# Patient Record
Sex: Female | Born: 1950 | Race: White | Hispanic: No | Marital: Married | Smoking: Former smoker
Health system: Southern US, Community
[De-identification: ages and names within clinical notes are randomized; demographics above are authoritative.]

## PROBLEM LIST (undated history)

## (undated) DIAGNOSIS — E0500 Thyrotoxicosis with diffuse goiter without thyrotoxic crisis or storm: Secondary | ICD-10-CM

## (undated) DIAGNOSIS — E039 Hypothyroidism, unspecified: Secondary | ICD-10-CM

## (undated) DIAGNOSIS — E559 Vitamin D deficiency, unspecified: Secondary | ICD-10-CM

## (undated) DIAGNOSIS — C801 Malignant (primary) neoplasm, unspecified: Secondary | ICD-10-CM

## (undated) DIAGNOSIS — E079 Disorder of thyroid, unspecified: Secondary | ICD-10-CM

## (undated) DIAGNOSIS — F329 Major depressive disorder, single episode, unspecified: Secondary | ICD-10-CM

## (undated) DIAGNOSIS — G479 Sleep disorder, unspecified: Secondary | ICD-10-CM

## (undated) DIAGNOSIS — R911 Solitary pulmonary nodule: Secondary | ICD-10-CM

## (undated) DIAGNOSIS — F419 Anxiety disorder, unspecified: Secondary | ICD-10-CM

## (undated) DIAGNOSIS — F32A Depression, unspecified: Secondary | ICD-10-CM

## (undated) HISTORY — DX: Vitamin D deficiency, unspecified: E55.9

## (undated) HISTORY — DX: Thyrotoxicosis with diffuse goiter without thyrotoxic crisis or storm: E05.00

## (undated) HISTORY — DX: Hypothyroidism, unspecified: E03.9

## (undated) HISTORY — DX: Anxiety disorder, unspecified: F41.9

## (undated) HISTORY — PX: PARATHYROIDECTOMY: SHX19

## (undated) HISTORY — PX: ABLATION: SHX5711

## (undated) HISTORY — DX: Solitary pulmonary nodule: R91.1

## (undated) HISTORY — PX: OTHER SURGICAL HISTORY: SHX169

## (undated) HISTORY — PX: COLONOSCOPY: SHX174

## (undated) HISTORY — DX: Hypercalcemia: E83.52

## (undated) HISTORY — PX: EYE SURGERY: SHX253

## (undated) HISTORY — DX: Disorder of thyroid, unspecified: E07.9

## (undated) HISTORY — DX: Sleep disorder, unspecified: G47.9

---

## 1999-05-18 ENCOUNTER — Encounter (INDEPENDENT_AMBULATORY_CARE_PROVIDER_SITE_OTHER): Payer: Self-pay

## 1999-05-18 ENCOUNTER — Other Ambulatory Visit: Admission: RE | Admit: 1999-05-18 | Discharge: 1999-05-18 | Payer: Self-pay | Admitting: Gynecology

## 1999-07-21 ENCOUNTER — Other Ambulatory Visit: Admission: RE | Admit: 1999-07-21 | Discharge: 1999-07-21 | Payer: Self-pay | Admitting: Gynecology

## 1999-07-21 ENCOUNTER — Encounter (INDEPENDENT_AMBULATORY_CARE_PROVIDER_SITE_OTHER): Payer: Self-pay | Admitting: Specialist

## 1999-09-26 ENCOUNTER — Encounter: Admission: RE | Admit: 1999-09-26 | Discharge: 1999-09-26 | Payer: Self-pay | Admitting: Family Medicine

## 1999-10-07 ENCOUNTER — Encounter: Admission: RE | Admit: 1999-10-07 | Discharge: 1999-10-07 | Payer: Self-pay | Admitting: Sports Medicine

## 1999-10-28 ENCOUNTER — Encounter: Admission: RE | Admit: 1999-10-28 | Discharge: 1999-10-28 | Payer: Self-pay | Admitting: Family Medicine

## 2000-02-03 ENCOUNTER — Encounter: Admission: RE | Admit: 2000-02-03 | Discharge: 2000-02-03 | Payer: Self-pay | Admitting: Family Medicine

## 2000-07-23 ENCOUNTER — Other Ambulatory Visit: Admission: RE | Admit: 2000-07-23 | Discharge: 2000-07-23 | Payer: Self-pay | Admitting: Gynecology

## 2001-01-18 ENCOUNTER — Encounter: Admission: RE | Admit: 2001-01-18 | Discharge: 2001-01-18 | Payer: Self-pay | Admitting: Family Medicine

## 2001-03-19 ENCOUNTER — Encounter: Admission: RE | Admit: 2001-03-19 | Discharge: 2001-03-19 | Payer: Self-pay | Admitting: Family Medicine

## 2001-08-21 ENCOUNTER — Other Ambulatory Visit: Admission: RE | Admit: 2001-08-21 | Discharge: 2001-08-21 | Payer: Self-pay | Admitting: Gynecology

## 2001-09-17 ENCOUNTER — Encounter: Admission: RE | Admit: 2001-09-17 | Discharge: 2001-09-17 | Payer: Self-pay | Admitting: Sports Medicine

## 2001-11-21 ENCOUNTER — Encounter: Admission: RE | Admit: 2001-11-21 | Discharge: 2001-11-21 | Payer: Self-pay | Admitting: Family Medicine

## 2002-01-06 ENCOUNTER — Encounter: Admission: RE | Admit: 2002-01-06 | Discharge: 2002-01-06 | Payer: Self-pay | Admitting: Family Medicine

## 2002-05-07 ENCOUNTER — Encounter: Admission: RE | Admit: 2002-05-07 | Discharge: 2002-05-07 | Payer: Self-pay | Admitting: Sports Medicine

## 2002-10-02 ENCOUNTER — Other Ambulatory Visit: Admission: RE | Admit: 2002-10-02 | Discharge: 2002-10-02 | Payer: Self-pay | Admitting: Gynecology

## 2003-01-30 ENCOUNTER — Encounter: Admission: RE | Admit: 2003-01-30 | Discharge: 2003-01-30 | Payer: Self-pay | Admitting: Family Medicine

## 2003-02-23 ENCOUNTER — Encounter: Admission: RE | Admit: 2003-02-23 | Discharge: 2003-02-23 | Payer: Self-pay | Admitting: Family Medicine

## 2003-03-26 ENCOUNTER — Encounter: Admission: RE | Admit: 2003-03-26 | Discharge: 2003-03-26 | Payer: Self-pay | Admitting: Family Medicine

## 2003-04-02 ENCOUNTER — Encounter: Admission: RE | Admit: 2003-04-02 | Discharge: 2003-04-02 | Payer: Self-pay | Admitting: Family Medicine

## 2003-06-02 ENCOUNTER — Encounter: Admission: RE | Admit: 2003-06-02 | Discharge: 2003-06-02 | Payer: Self-pay | Admitting: Family Medicine

## 2003-11-27 ENCOUNTER — Encounter: Admission: RE | Admit: 2003-11-27 | Discharge: 2003-11-27 | Payer: Self-pay | Admitting: Sports Medicine

## 2003-12-25 ENCOUNTER — Encounter: Admission: RE | Admit: 2003-12-25 | Discharge: 2003-12-25 | Payer: Self-pay | Admitting: Family Medicine

## 2004-01-22 ENCOUNTER — Encounter: Admission: RE | Admit: 2004-01-22 | Discharge: 2004-01-22 | Payer: Self-pay | Admitting: Family Medicine

## 2004-02-05 ENCOUNTER — Encounter: Admission: RE | Admit: 2004-02-05 | Discharge: 2004-02-05 | Payer: Self-pay | Admitting: Family Medicine

## 2004-03-09 ENCOUNTER — Encounter: Admission: RE | Admit: 2004-03-09 | Discharge: 2004-03-09 | Payer: Self-pay | Admitting: Family Medicine

## 2004-03-29 ENCOUNTER — Ambulatory Visit: Payer: Self-pay | Admitting: Family Medicine

## 2004-06-10 ENCOUNTER — Encounter (INDEPENDENT_AMBULATORY_CARE_PROVIDER_SITE_OTHER): Payer: Self-pay | Admitting: *Deleted

## 2004-06-10 ENCOUNTER — Ambulatory Visit (HOSPITAL_COMMUNITY): Admission: RE | Admit: 2004-06-10 | Discharge: 2004-06-10 | Payer: Self-pay | Admitting: Gastroenterology

## 2004-07-12 ENCOUNTER — Ambulatory Visit: Payer: Self-pay | Admitting: Family Medicine

## 2004-09-14 ENCOUNTER — Other Ambulatory Visit: Admission: RE | Admit: 2004-09-14 | Discharge: 2004-09-14 | Payer: Self-pay | Admitting: Gynecology

## 2005-05-05 ENCOUNTER — Ambulatory Visit: Payer: Self-pay | Admitting: Family Medicine

## 2005-10-18 ENCOUNTER — Other Ambulatory Visit: Admission: RE | Admit: 2005-10-18 | Discharge: 2005-10-18 | Payer: Self-pay | Admitting: Gynecology

## 2006-01-07 ENCOUNTER — Encounter (INDEPENDENT_AMBULATORY_CARE_PROVIDER_SITE_OTHER): Payer: Self-pay | Admitting: *Deleted

## 2006-01-07 LAB — CONVERTED CEMR LAB

## 2006-01-22 ENCOUNTER — Ambulatory Visit: Payer: Self-pay | Admitting: Family Medicine

## 2006-03-08 ENCOUNTER — Ambulatory Visit: Payer: Self-pay | Admitting: Family Medicine

## 2006-03-21 ENCOUNTER — Ambulatory Visit: Payer: Self-pay | Admitting: Sports Medicine

## 2006-06-07 ENCOUNTER — Ambulatory Visit: Payer: Self-pay | Admitting: Family Medicine

## 2006-09-06 DIAGNOSIS — E039 Hypothyroidism, unspecified: Secondary | ICD-10-CM | POA: Insufficient documentation

## 2006-09-06 DIAGNOSIS — F411 Generalized anxiety disorder: Secondary | ICD-10-CM | POA: Insufficient documentation

## 2006-09-06 DIAGNOSIS — F17200 Nicotine dependence, unspecified, uncomplicated: Secondary | ICD-10-CM | POA: Insufficient documentation

## 2006-09-06 DIAGNOSIS — F3289 Other specified depressive episodes: Secondary | ICD-10-CM | POA: Insufficient documentation

## 2006-09-06 DIAGNOSIS — F329 Major depressive disorder, single episode, unspecified: Secondary | ICD-10-CM | POA: Insufficient documentation

## 2006-09-06 DIAGNOSIS — J309 Allergic rhinitis, unspecified: Secondary | ICD-10-CM | POA: Insufficient documentation

## 2006-09-06 DIAGNOSIS — E0500 Thyrotoxicosis with diffuse goiter without thyrotoxic crisis or storm: Secondary | ICD-10-CM | POA: Insufficient documentation

## 2006-09-06 DIAGNOSIS — G4700 Insomnia, unspecified: Secondary | ICD-10-CM | POA: Insufficient documentation

## 2006-09-07 ENCOUNTER — Encounter (INDEPENDENT_AMBULATORY_CARE_PROVIDER_SITE_OTHER): Payer: Self-pay | Admitting: *Deleted

## 2006-12-05 ENCOUNTER — Telehealth: Payer: Self-pay | Admitting: *Deleted

## 2006-12-06 ENCOUNTER — Encounter (INDEPENDENT_AMBULATORY_CARE_PROVIDER_SITE_OTHER): Payer: Self-pay | Admitting: Family Medicine

## 2006-12-06 ENCOUNTER — Ambulatory Visit: Payer: Self-pay | Admitting: Family Medicine

## 2006-12-17 ENCOUNTER — Telehealth (INDEPENDENT_AMBULATORY_CARE_PROVIDER_SITE_OTHER): Payer: Self-pay | Admitting: Family Medicine

## 2006-12-18 ENCOUNTER — Telehealth (INDEPENDENT_AMBULATORY_CARE_PROVIDER_SITE_OTHER): Payer: Self-pay | Admitting: Family Medicine

## 2007-01-02 ENCOUNTER — Telehealth (INDEPENDENT_AMBULATORY_CARE_PROVIDER_SITE_OTHER): Payer: Self-pay | Admitting: Family Medicine

## 2007-01-02 ENCOUNTER — Ambulatory Visit: Payer: Self-pay | Admitting: Family Medicine

## 2007-01-03 ENCOUNTER — Encounter (INDEPENDENT_AMBULATORY_CARE_PROVIDER_SITE_OTHER): Payer: Self-pay | Admitting: *Deleted

## 2007-01-10 ENCOUNTER — Telehealth (INDEPENDENT_AMBULATORY_CARE_PROVIDER_SITE_OTHER): Payer: Self-pay | Admitting: Family Medicine

## 2007-01-18 ENCOUNTER — Encounter (INDEPENDENT_AMBULATORY_CARE_PROVIDER_SITE_OTHER): Payer: Self-pay | Admitting: Family Medicine

## 2007-01-24 ENCOUNTER — Telehealth (INDEPENDENT_AMBULATORY_CARE_PROVIDER_SITE_OTHER): Payer: Self-pay | Admitting: *Deleted

## 2007-02-11 ENCOUNTER — Encounter (INDEPENDENT_AMBULATORY_CARE_PROVIDER_SITE_OTHER): Payer: Self-pay | Admitting: Family Medicine

## 2007-02-12 ENCOUNTER — Other Ambulatory Visit: Admission: RE | Admit: 2007-02-12 | Discharge: 2007-02-12 | Payer: Self-pay | Admitting: Gynecology

## 2007-02-25 ENCOUNTER — Encounter (INDEPENDENT_AMBULATORY_CARE_PROVIDER_SITE_OTHER): Payer: Self-pay | Admitting: Family Medicine

## 2008-05-28 ENCOUNTER — Other Ambulatory Visit: Admission: RE | Admit: 2008-05-28 | Discharge: 2008-05-28 | Payer: Self-pay | Admitting: Gynecology

## 2009-12-15 ENCOUNTER — Emergency Department (HOSPITAL_COMMUNITY): Admission: EM | Admit: 2009-12-15 | Discharge: 2009-12-15 | Payer: Self-pay | Admitting: Emergency Medicine

## 2010-09-26 LAB — DIFFERENTIAL
Basophils Absolute: 0.1 10*3/uL (ref 0.0–0.1)
Basophils Relative: 1 % (ref 0–1)
Eosinophils Absolute: 0.1 10*3/uL (ref 0.0–0.7)
Eosinophils Relative: 2 % (ref 0–5)
Lymphocytes Relative: 33 % (ref 12–46)
Lymphs Abs: 2.4 10*3/uL (ref 0.7–4.0)
Neutrophils Relative %: 61 % (ref 43–77)

## 2010-09-26 LAB — COMPREHENSIVE METABOLIC PANEL
ALT: 18 U/L (ref 0–35)
AST: 20 U/L (ref 0–37)
Albumin: 4.2 g/dL (ref 3.5–5.2)
Alkaline Phosphatase: 102 U/L (ref 39–117)
GFR calc Af Amer: >60 mL/min (ref >60)
GFR calc non Af Amer: >60 mL/min (ref >60)
Glucose, Bld: 91 mg/dL (ref 70–99)
Sodium: 140 mEq/L (ref 135–145)
Total Bilirubin: 0.5 mg/dL (ref 0.3–1.2)
Total Protein: 7 g/dL (ref 6.0–8.3)

## 2010-09-26 LAB — CBC
HCT: 40 % (ref 36.0–46.0)
MCHC: 33 g/dL (ref 30.0–36.0)
WBC: 7.4 10*3/uL (ref 4.0–10.5)

## 2010-09-26 LAB — URINALYSIS, ROUTINE W REFLEX MICROSCOPIC: pH: 7.5 (ref 5.0–8.0)

## 2010-09-26 LAB — LIPASE, BLOOD: Lipase: 30 U/L (ref 11–59)

## 2010-11-25 NOTE — Op Note (Signed)
NAMEJANIT, Rachael Villanueva               ACCOUNT NO.:  [id number]   MEDICAL RECORD NO.:  [id number]          PATIENT TYPE:  AMB   LOCATION:  ENDO                         FACILITY:  [facility]   PHYSICIAN:  James L. Malon Kindle., M.D.DATE OF BIRTH:  12-29-1950   DATE OF PROCEDURE:  06/10/2004  DATE OF DISCHARGE:                                 OPERATIVE REPORT   PROCEDURE:  Colonoscopy and biopsy.   MEDICATIONS GIVEN:  Fentanyl 50 mcg, Versed 7 mg IV.   INDICATIONS FOR PROCEDURE:  Strong family history of colon cancer.   DESCRIPTION OF PROCEDURE:  The procedure was explained and patient consent  obtained.  With the patient in the left lateral decubitus position, the  Olympus scope was inserted and advanced.  The pediatric adjustable scope was  used.  We advanced to the area of the cecum.  There was a small spot across  from the ileocecal valve that could have been a small polyp, this was cold  biopsied.  No other polyps were seen in the cecum, ascending colon,  transverse colon, splenic flexure, descending or sigmoid colon.  There was  no significant diverticular disease.  The rectum was free of polyps.  The  scope was withdrawn.  The patient tolerated the procedure well.   ASSESSMENT:  1.  Possible very small cecal polyp, 211.3.  2.  Family history of colon cancer, V16.0.   PLAN:  Will see back in the office in six weeks, will check path, and will  repeat procedure in five years.       JLE/MEDQ  D:  06/10/2004  T:  06/10/2004  Job:  [id number]   cc:   Penni Bombard, MD  Fax: [phone]

## 2014-04-20 ENCOUNTER — Encounter: Payer: Self-pay | Admitting: *Deleted

## 2015-09-29 ENCOUNTER — Ambulatory Visit (INDEPENDENT_AMBULATORY_CARE_PROVIDER_SITE_OTHER): Payer: Medicare Other | Admitting: Neurology

## 2015-09-29 ENCOUNTER — Encounter: Payer: Self-pay | Admitting: Neurology

## 2015-09-29 VITALS — BP 118/62 | HR 80 | Resp 16 | Ht 60.0 in | Wt 124.0 lb

## 2015-09-29 DIAGNOSIS — G478 Other sleep disorders: Secondary | ICD-10-CM

## 2015-09-29 DIAGNOSIS — R51 Headache: Secondary | ICD-10-CM | POA: Diagnosis not present

## 2015-09-29 DIAGNOSIS — R0683 Snoring: Secondary | ICD-10-CM | POA: Diagnosis not present

## 2015-09-29 DIAGNOSIS — R0689 Other abnormalities of breathing: Secondary | ICD-10-CM | POA: Diagnosis not present

## 2015-09-29 DIAGNOSIS — R0681 Apnea, not elsewhere classified: Secondary | ICD-10-CM

## 2015-09-29 DIAGNOSIS — G2581 Restless legs syndrome: Secondary | ICD-10-CM | POA: Diagnosis not present

## 2015-09-29 DIAGNOSIS — R519 Headache, unspecified: Secondary | ICD-10-CM

## 2015-09-29 NOTE — Patient Instructions (Signed)
We will do a sleep study to look for an organic cause for your sleep problems.

## 2015-09-29 NOTE — Progress Notes (Signed)
Subjective:    Patient ID: Rachael Villanueva is a 65 y.o. female.  HPI    Star Age, MD, PhD [facility] [address], [address]O. [place], [place] [address]  Dear Ginny Forth,   I saw your patient, Rachael Villanueva, upon your kind request in my neurologic clinic today for initial consultation of her sleep disorder, in particular, concern for underlying obstructive sleep apnea. The patient is unaccompanied today. As you know, Rachael Villanueva is a 65 year old right-handed woman with an underlying medical history of hypothyroidism, anxiety, and depression, smoking, recovering alcoholic, who reports snoring and witnessed apneas. She is fatigued. Her ESS is 0/24. Her fatigue score is 53/63. She has been on Viibryd, weaning off of it, Trazodone at night 50 mg 2 pills at night, Xanax 0.5 mg 1/2 pill at night.  She has not been able to keep a sleep schedule. She occasionally wakes up with a headache. She has a feeling of doom at times, no Nocturia, had restless leg symptoms but nothing in the last year. She does not keep asleep and wake schedule. She is not able to keep a scheduled. She works at the computer at night. She has Bar[employer] but has not been able to function she feels. She denies any hypnagogic or hypnopompic hallucinations, cataplexy, but may have had sleep paralysis but is not able to elaborate. She needs a lot of redirection and refocusing during the interview and asks the same question again. She reports having to take care of her father who had Parkinson's disease. He passed away in 07/23/2014. She had to hire a caregiver. She feels that she is still stuck in that a regular sleep schedule she had at the time. She has been on Nuvigil. She was taking brand-name Nuvigil 250 mg once daily, this was changed to generic in 07/24/15 which she feels this is not working as well. Her husband is usually already asleep when she tries to go to bed. She smokes typically less  than half a pack per day. She drinks 1-1/2 cups of coffee daily, typically no sodas and has not had any alcohol since 1989. She says she is a recovering alcoholic. She does not watch TV in bed. She lives with her husband. She does not have any children. She has no pets. Her weight has been stable. She denies any significant daytime somnolence or sleep attacks.  Her Past Medical History Is Significant For: Past Medical History  Diagnosis Date  . Thyroid disease   . Anxiety   . Hypothyroidism     s/p post ablation for Graves Disease  . Graves' disease     Her Past Surgical History Is Significant For: Past Surgical History  Procedure Laterality Date  . Eye surgery    . Parathyroidectomy      Her Family History Is Significant For: Family History  Problem Relation Age of Onset  . Thyroid disease Mother   . Parkinson's disease Father     Her Social History Is Significant For: Social History   Social History  . Marital Status: Married    Spouse Name: N/A  . Number of Children: 0  . Years of Education: college   Occupational History  . Visual Artist     Social History Main Topics  . Smoking status: Light Tobacco Smoker    Types: Cigarettes  . Smokeless tobacco: None  . Alcohol Use: No     Comment: Quit 1989  . Drug Use: No  .  Sexual Activity: Not Asked   Other Topics Concern  . None   Social History Narrative   Drinks 1.5 coffee a day     Her Allergies Are:  No Known Allergies:   Her Current Medications Are:  Outpatient Encounter Prescriptions as of 09/29/2015  Medication Sig  . ergocalciferol (VITAMIN D2) 50000 units capsule Take 50,000 Units by mouth once a week.  Marland Kitchen FLUoxetine (PROZAC) 20 MG capsule   . levothyroxine (SYNTHROID, LEVOTHROID) 125 MCG tablet   . traZODone (DESYREL) 100 MG tablet Take 100 mg by mouth at bedtime.  Marland Kitchen VIIBRYD 40 MG TABS    No facility-administered encounter medications on file as of 09/29/2015.  :  Review of Systems:  Out of a  complete 14 point review of systems, all are reviewed and negative with the exception of these symptoms as listed below:  Review of Systems  Neurological:       Patient states that she has to take name brand Nuvigil but insurance has denied.  Patient reports that she has trouble sleeping, snoring, daytime sleepiness  Epworth Sleepiness Scale 0= would never doze 1= slight chance of dozing 2= moderate chance of dozing 3= high chance of dozing  Sitting and reading:0 Watching TV:0 Sitting inactive in a public place (ex. Theater or meeting):0 As a passenger in a car for an hour without a break:0 Lying down to rest in the afternoon:0 Sitting and talking to someone:0 Sitting quietly after lunch (no alcohol):0 In a car, while stopped in traffic:0 Total:0  Objective:  Neurologic Exam  Physical Exam Physical Examination:   Filed Vitals:   09/29/15 1343  BP: 118/62  Pulse: 80  Resp: 16    General Examination: The patient is a very pleasant 65 y.o. female in no acute distress. She appears well-developed and well-nourished and well groomed. She is anxious, difficult for her to focus, needs redirection.   HEENT: Normocephalic, atraumatic, pupils are equal, round and reactive to light and accommodation. Extraocular tracking is good without limitation to gaze excursion or nystagmus noted. Normal smooth pursuit is noted. Hearing is grossly intact. Tympanic membranes are clear bilaterally. Face is symmetric with normal facial animation and normal facial sensation. Speech is clear with no dysarthria noted. There is no hypophonia. There is no lip, neck/head, jaw or voice tremor. Neck is supple with full range of passive and active motion. There are no carotid bruits on auscultation. Oropharynx exam reveals: moderate mouth dryness, adequate dental hygiene and mild airway crowding, due toredundant soft palate. Tonsils are absent. Mallampati is class I. Neck circumference is 13 inches.Tongue protrudes  centrally and palate elevates symmetrically.   Chest: Clear to auscultation without wheezing, rhonchi or crackles noted.  Heart: S1+S2+0, regular and normal without murmurs, rubs or gallops noted.   Abdomen: Soft, non-tender and non-distended with normal bowel sounds appreciated on auscultation.  Extremities: There is no pitting edema in the distal lower extremities bilaterally. Pedal pulses are intact.  Skin: Warm and dry without trophic changes noted.   Musculoskeletal: exam reveals no obvious joint deformities, tenderness or joint swelling or erythema.   Neurologically:  Mental status: The patient is awake, alert and oriented in all 4 spheres. Her immediate and remote memory, attention, language skills and fund of knowledge are appropriate. There is no evidence of aphasia, agnosia, apraxia or anomia. Speech is clear with normal prosody and enunciation. Thought process is linear. Mood is anxious, constricted and decreased range and affect is blunted and constricted.  Cranial nerves II -  XII are as described above under HEENT exam. In addition: shoulder shrug is normal with equal shoulder height noted. Motor exam: Normal bulk, strength and tone is noted. There is no drift, tremor or rebound. Romberg is negative. Reflexes are 2+ throughout. Fine motor skills and coordination: intact. There is no truncal or gait ataxia.  Sensory exam: intact to light touch.   Gait, station and balance: She stands easily. No veering to one side is noted. No leaning to one side is noted. Posture is age-appropriate and stance is narrow based. Gait shows normal stride length and normal pace. No problems turning are noted.                Assessment and Plan:   In summary, Rachael Villanueva is a very pleasant 65 y.o.-year old female with an underlying medical history of hypothyroidism, anxiety, and depression, smoking, recovering alcoholic, who reports difficulty with her sleep including difficulty falling asleep,  maintaining sleep, snoring, feeling tired during the day, waking up with a sense of gasping, witnessed breathing pauses while asleep, remote history of restless leg symptoms. I suggested we proceed with a sleep study. Her history is not suggestive of narcolepsy or idiopathic hypersomnolence. I talked her about organic sleep disorder such as PLMD, and obstructive sleep apnea. She would be willing to try sleep apnea treatment. She is advised that if she has sleep apnea we can try CPAP therapy. She can bring her nighttime medication for her sleep study. She is apprehensive about not being able to sleep during the sleep study. I tried to reassure the patient. She had multiple questions which I tried to answer to the best of my knowledge. I explained to her that I could not prescribe brand-name Nuvigil for her at this time. She will continue to have medication management through you. I explained the risks and ramifications of untreated moderate to severe OSA, especially with respect to developing cardiovascular disease down the Road, including congestive heart failure, difficult to treat hypertension, cardiac arrhythmias, or stroke. Even type 2 diabetes has, in part, been linked to untreated OSA. Symptoms of untreated OSA include daytime sleepiness, memory problems, mood irritability and mood disorder such as depression and anxiety, lack of energy, as well as recurrent headaches, especially morning headaches. I recommended the following at this time: sleep study with potential positive airway pressure titration. (We will score hypopneas at 4% and split the sleep study into diagnostic and treatment portion, if the estimated. 2 hour AHI is >15/h).   I explained the sleep test procedure to the patient and explained the CPAP treatment option to the patient. I answered all her questions today and will see her back after the sleep study is completed.   Thank you very much for allowing me to participate in the care of this  nice patient. If I can be of any further assistance to you please do not hesitate to call me at [phone].  Sincerely,   Star Age, MD, PhD

## 2015-10-04 ENCOUNTER — Encounter: Payer: Self-pay | Admitting: *Deleted

## 2016-06-28 ENCOUNTER — Emergency Department (HOSPITAL_COMMUNITY): Payer: Medicare Other

## 2016-06-28 ENCOUNTER — Emergency Department (HOSPITAL_COMMUNITY)
Admission: EM | Admit: 2016-06-28 | Discharge: 2016-06-28 | Disposition: A | Discharge status: Home / Self Care | Payer: Medicare Other | Attending: Emergency Medicine | Admitting: Emergency Medicine

## 2016-06-28 ENCOUNTER — Encounter (HOSPITAL_COMMUNITY): Payer: Self-pay | Admitting: Emergency Medicine

## 2016-06-28 DIAGNOSIS — W010XXA Fall on same level from slipping, tripping and stumbling without subsequent striking against object, initial encounter: Secondary | ICD-10-CM | POA: Diagnosis not present

## 2016-06-28 DIAGNOSIS — F17210 Nicotine dependence, cigarettes, uncomplicated: Secondary | ICD-10-CM | POA: Diagnosis not present

## 2016-06-28 DIAGNOSIS — S82402A Unspecified fracture of shaft of left fibula, initial encounter for closed fracture: Secondary | ICD-10-CM

## 2016-06-28 DIAGNOSIS — S82202A Unspecified fracture of shaft of left tibia, initial encounter for closed fracture: Secondary | ICD-10-CM

## 2016-06-28 DIAGNOSIS — Y999 Unspecified external cause status: Secondary | ICD-10-CM | POA: Diagnosis not present

## 2016-06-28 DIAGNOSIS — Y939 Activity, unspecified: Secondary | ICD-10-CM | POA: Insufficient documentation

## 2016-06-28 DIAGNOSIS — S82122A Displaced fracture of lateral condyle of left tibia, initial encounter for closed fracture: Secondary | ICD-10-CM | POA: Diagnosis not present

## 2016-06-28 DIAGNOSIS — Y92520 Airport as the place of occurrence of the external cause: Secondary | ICD-10-CM | POA: Diagnosis not present

## 2016-06-28 DIAGNOSIS — S82832A Other fracture of upper and lower end of left fibula, initial encounter for closed fracture: Secondary | ICD-10-CM | POA: Insufficient documentation

## 2016-06-28 DIAGNOSIS — S8992XA Unspecified injury of left lower leg, initial encounter: Secondary | ICD-10-CM | POA: Diagnosis present

## 2016-06-28 DIAGNOSIS — E039 Hypothyroidism, unspecified: Secondary | ICD-10-CM | POA: Diagnosis not present

## 2016-06-28 DIAGNOSIS — Z79899 Other long term (current) drug therapy: Secondary | ICD-10-CM | POA: Diagnosis not present

## 2016-06-28 MED ORDER — OXYCODONE-ACETAMINOPHEN 5-325 MG PO TABS
2.0000 | ORAL_TABLET | Freq: Once | ORAL | Status: AC
  Administered 2016-06-28: 2 via ORAL
  Filled 2016-06-28: qty 2

## 2016-06-28 MED ORDER — OXYCODONE-ACETAMINOPHEN 5-325 MG PO TABS: 2.0000 | ORAL_TABLET | ORAL | 0 refills | Status: DC | PRN

## 2016-06-28 MED ORDER — METHOCARBAMOL 500 MG PO TABS: 500.0000 mg | ORAL_TABLET | Freq: Two times a day (BID) | ORAL | 0 refills | Status: DC

## 2016-06-28 NOTE — ED Notes (Signed)
Patient d/c's in the care of family member.  F/U and medications discussed.  Patient verbalized understanding.

## 2016-06-28 NOTE — ED Triage Notes (Signed)
Per EMS[place]. Patient fell on left knee and left shoulder. No loss of consciousness and denied hitting head. Denies neck/back pain.

## 2016-06-28 NOTE — ED Provider Notes (Signed)
[facility] DEPT Provider Note   CSN: [id number] Arrival date & time: 06/28/16  1354     History   Chief Complaint Chief Complaint  Patient presents with  . Fall    HPI Rachael Villanueva is a 65 y.o. female.  65 year old female presents after slipping on a wet floor at the airport and falling onto her left knee. Denies any head injury. No back pain. Complains of sharp right-sided upper rib pain is worse with movement but without associated dyspnea. Also has some sharp left shoulder pain is also worse with movement or with remaining still. Denies any numbness or weakness to her left hand. Most of her pain is at her left knee and she is unable to ambulate. Denies any hip pain. No foot or ankle discomfort. EMS called and patient transported here      Past Medical History:  Diagnosis Date  . Anxiety   . Graves' disease   . Hypothyroidism    s/p post ablation for Graves Disease  . Thyroid disease     Patient Active Problem List   Diagnosis Date Noted  . [place] DISEASE 09/06/2006  . HYPOTHYROIDISM, UNSPECIFIED 09/06/2006  . ANXIETY 09/06/2006  . TOBACCO DEPENDENCE 09/06/2006  . DEPRESSIVE DISORDER, NOS 09/06/2006  . RHINITIS, ALLERGIC 09/06/2006  . INSOMNIA NOS 09/06/2006    Past Surgical History:  Procedure Laterality Date  . EYE SURGERY    . PARATHYROIDECTOMY      OB History    No data available       Home Medications    Prior to Admission medications   Medication Sig Start Date End Date Taking? Authorizing Provider  chlorhexidine (PERIDEX) 0.12 % solution Use as directed 10 mLs in the mouth or throat 2 (two) times daily.  06/07/16  Yes Historical Provider, MD  Cobalamine Combinations (B12 FOLATE PO) Take 1 tablet by mouth daily.   Yes Historical Provider, MD  ergocalciferol (VITAMIN D2) 50000 units capsule Take 50,000 Units by mouth once a week.   Yes Historical Provider, MD  levothyroxine (SYNTHROID, LEVOTHROID) 112 MCG tablet  03/29/16  Yes Historical  Provider, MD  traZODone (DESYREL) 50 MG tablet Take 50 mg by mouth at bedtime as needed for sleep.  04/24/16  Yes Historical Provider, MD  VIIBRYD 40 MG TABS Take 40 mg by mouth daily.  08/17/15  Yes Historical Provider, MD  FLUoxetine (PROZAC) 20 MG capsule Take 20 mg by mouth.  09/18/15   Historical Provider, MD  levothyroxine (SYNTHROID, LEVOTHROID) 125 MCG tablet  09/27/15   Historical Provider, MD  traZODone (DESYREL) 100 MG tablet Take 100 mg by mouth at bedtime.    Historical Provider, MD    Family History Family History  Problem Relation Age of Onset  . Thyroid disease Mother   . Parkinson's disease Father     Social History Social History  Substance Use Topics  . Smoking status: Light Tobacco Smoker    Types: Cigarettes  . Smokeless tobacco: Never Used  . Alcohol use No     Comment: Quit 1989     Allergies   Patient has no known allergies.   Review of Systems Review of Systems  All other systems reviewed and are negative.    Physical Exam Updated Vital Signs BP 153/87 (BP Location: Left Arm)   Pulse 84   Temp 98 F (36.7 C) (Oral)   Resp 18   Ht 5' (1.524 m)   Wt 56.2 kg   SpO2 100%   BMI 24.22 kg/m  Physical Exam  Constitutional: She is oriented to person, place, and time. She appears well-developed and well-nourished.  Non-toxic appearance. No distress.  HENT:  Head: Normocephalic and atraumatic.  Eyes: Conjunctivae, EOM and lids are normal. Pupils are equal, round, and reactive to light.  Neck: Normal range of motion. Neck supple. No tracheal deviation present. No thyroid mass present.  Cardiovascular: Normal rate, regular rhythm and normal heart sounds.  Exam reveals no gallop.   No murmur heard. Pulmonary/Chest: Effort normal and breath sounds normal. No stridor. No respiratory distress. She has no decreased breath sounds. She has no wheezes. She has no rhonchi. She has no rales. She exhibits tenderness. She exhibits no crepitus.    Abdominal:  Soft. Normal appearance and bowel sounds are normal. She exhibits no distension. There is no tenderness. There is no rebound and no CVA tenderness.  Musculoskeletal: She exhibits no edema.       Left shoulder: She exhibits tenderness. She exhibits normal range of motion and no swelling.       Left knee: She exhibits decreased range of motion and swelling.       Arms:      Legs: Neurological: She is alert and oriented to person, place, and time. She has normal strength. No cranial nerve deficit or sensory deficit. GCS eye subscore is 4. GCS verbal subscore is 5. GCS motor subscore is 6.  Skin: Skin is warm and dry. No abrasion and no rash noted.  Psychiatric: She has a normal mood and affect. Her speech is normal and behavior is normal.  Nursing note and vitals reviewed.    ED Treatments / Results  Labs (all labs ordered are listed, but only abnormal results are displayed) Labs Reviewed - No data to display  EKG  EKG Interpretation None       Radiology Dg Shoulder Left  Result Date: 06/28/2016 CLINICAL DATA:  Left shoulder pain, fall today EXAM: LEFT SHOULDER - 2+ VIEW COMPARISON:  None. FINDINGS: Three views of the left shoulder submitted. No acute fracture or subluxation. AC joint and glenohumeral joint are preserved. IMPRESSION: Negative. Electronically Signed   By: Lahoma Crocker M.D.   On: 06/28/2016 15:42   Dg Knee Complete 4 Views Left  Result Date: 06/28/2016 CLINICAL DATA:  Left knee pain after fall 1 day prior. EXAM: LEFT KNEE - COMPLETE 4+ VIEW COMPARISON:  None. FINDINGS: There is a large lipohemarthrosis in the suprapatellar left knee joint. There is a lateral left tibial plateau fracture extending to the medial left tibial spine, with probable minimal 2 mm depression of the lateral tibial plateau fracture fragment. No additional fracture. No dislocation. No suspicious focal osseous lesion. No radiopaque foreign body. IMPRESSION: Lateral left tibial plateau fracture with  minimal depression and large lipohemarthrosis. Electronically Signed   By: Ilona Sorrel M.D.   On: 06/28/2016 15:42    Procedures Procedures (including critical care time)  Medications Ordered in ED Medications  oxyCODONE-acetaminophen (PERCOCET/ROXICET) 5-325 MG per tablet 2 tablet (2 tablets Oral Given 06/28/16 1655)     Initial Impression / Assessment and Plan / ED Course  I have reviewed the triage vital signs and the nursing notes.  Pertinent labs & imaging results that were available during my care of the patient were reviewed by me and considered in my medical decision making (see chart for details).  Clinical Course     Patient given Percocet here for pain and feels better. Discussed with Dr. Marlou Sa and patient will have CT of  her tibia and will call the office tomorrow for follow-up appointment  Final Clinical Impressions(s) / ED Diagnoses   Final diagnoses:  None    New Prescriptions New Prescriptions   No medications on file     Lacretia Leigh, MD 06/28/16 1840

## 2016-06-29 ENCOUNTER — Encounter (INDEPENDENT_AMBULATORY_CARE_PROVIDER_SITE_OTHER): Payer: Self-pay | Admitting: Orthopedic Surgery

## 2016-06-29 ENCOUNTER — Ambulatory Visit (INDEPENDENT_AMBULATORY_CARE_PROVIDER_SITE_OTHER): Payer: Medicare Other | Admitting: Orthopedic Surgery

## 2016-06-29 DIAGNOSIS — S82122A Displaced fracture of lateral condyle of left tibia, initial encounter for closed fracture: Secondary | ICD-10-CM | POA: Diagnosis not present

## 2016-06-29 DIAGNOSIS — S82832A Other fracture of upper and lower end of left fibula, initial encounter for closed fracture: Secondary | ICD-10-CM | POA: Diagnosis not present

## 2016-06-29 MED ORDER — OXYCODONE HCL 5 MG PO TABS: 5.0000 mg | ORAL_TABLET | ORAL | 0 refills | Status: DC | PRN

## 2016-06-29 NOTE — Progress Notes (Signed)
Office Visit Note   Patient: Rachael Villanueva           Date of Birth: 28-Dec-1950           MRN: [id number] Visit Date: 06/29/2016 Requested by: Orpah Melter, MD [address] [place], [place] [address] PCP: Orpah Melter, MD  Subjective: Chief Complaint  Patient presents with  . Left Knee - Pain, Injury, Fracture    HPI Rachael Villanueva is a 65 year old female who [place] and fell.  Sustained a left knee injury.  Emergency room visit demonstrated a nondisplaced tibial plateau fracture.  She denies pain in the left knee.  Denies any other orthopedic complaints.  No fevers or chills.  No family history of DVT or pulmonary embolism.              Review of Systems All systems reviewed are negative as they relate to the chief complaint within the history of present illness.  Patient denies  fevers or chills.    Assessment & Plan: Visit Diagnoses:  1. Closed displaced fracture of lateral condyle of left tibia, initial encounter   2. Closed fracture fibula, head, left, initial encounter     Plan: Impression is tibial plateau fracture and fibular head fracture both of which are closed.  Displacement is minimal.  CT scan is reviewed with the patient and her husband.  I will keep her in a knee immobilizer for a couple weeks and then we'll start some motion.  Continue nonweightbearing.  Start 1 aspirin a day.  Continue with ankle pump exercises.  Prescription for oxycodone provided.  She has some osteopenia and that may need to be worked up further in the near future.  She's tried Fosamax before which was poorly tolerated  Follow-Up Instructions: Return in about 3 weeks (around 07/20/2016).   Orders:  No orders of the defined types were placed in this encounter.  Meds ordered this encounter  Medications  . oxyCODONE (OXY IR/ROXICODONE) 5 MG immediate release tablet    Sig: Take 1 tablet (5 mg total) by mouth every 4 (four) hours as needed for  severe pain.    Dispense:  40 tablet    Refill:  0      Procedures: No procedures performed   Clinical Data: No additional findings.  Objective: Vital Signs: There were no vitals taken for this visit.  Physical Exam   Constitutional: Patient appears well-developed HEENT:  Head: Normocephalic Eyes:EOM are normal Neck: Normal range of motion Cardiovascular: Normal rate Pulmonary/chest: Effort normal Neurologic: Patient is alert Skin: Skin is warm Psychiatric: Patient has normal mood and affect    Ortho Exam examination the left knee demonstrates soft compartments palpable pedal pulses on the left intact ankle dorsi flexion plantar flexion intact sensation in the foot no groin pain with internal/external rotation of the leg effusion in the left knee is present extensor mechanism is intact  Specialty Comments:  No specialty comments available.  Imaging: Dg Ribs Unilateral W/chest Right  Result Date: 06/28/2016 CLINICAL DATA:  Slip and fall on ice today, now with right lateral chest wall pain EXAM: RIGHT RIBS AND CHEST - 3+ VIEW COMPARISON:  12/15/2009 FINDINGS: No fracture or other bone lesions are seen involving the ribs. There is no evidence of pneumothorax or pleural effusion. Both lungs are clear except for minimal curvilinear atelectasis in the left lateral costophrenic angle. Heart size and mediastinal contours are within normal limits. IMPRESSION: Negative. Electronically Signed  By: Andreas Newport M.D.   On: 06/28/2016 18:05   Ct Tibia Fibula Left Wo Contrast  Result Date: 06/29/2016 CLINICAL DATA:  65 year old female with fall and left knee pain. Lateral tibial plateau fracture noted on an earlier radiograph. EXAM: CT OF THE LOWER LEFT EXTREMITY WITHOUT CONTRAST TECHNIQUE: Multidetector CT imaging of the lower left extremity was performed according to the standard protocol. COMPARISON:  Left knee radiograph dated 06/28/2016 FINDINGS: Bones/Joint/Cartilage There  is advanced osteopenia which limits evaluation of the bones and fracture. There is a comminuted minimally Depressed fracture of the lateral tibial plateau. There is extension of the fracture line to the lateral aspect of the medial tibial plateau and intercondylar eminence. There is a nondisplaced fracture of the fibular head. There is no dislocation. There is a moderate suprapatellar lipohemarthrosis. Ligaments Suboptimally assessed by CT. There is mild haziness of the fat surrounding the ACL and PCL. This may be related to joint effusion. If there is clinical concern for ligamentous injury MRI is recommended for further evaluation. Muscles and Tendons No acute findings.  No intramuscular hematoma. Soft tissues Small skin contusion over the patella.  No hematoma. IMPRESSION: Minimally depressed fracture of the lateral tibial plateau and nondisplaced fracture of the fibular head. No dislocation. Advanced osteopenia limits evaluation of the bones. Moderate suprapatellar lipohemarthrosis. Electronically Signed   By: Anner Crete M.D.   On: 06/29/2016 03:28   Dg Shoulder Left  Result Date: 06/28/2016 CLINICAL DATA:  Left shoulder pain, fall today EXAM: LEFT SHOULDER - 2+ VIEW COMPARISON:  None. FINDINGS: Three views of the left shoulder submitted. No acute fracture or subluxation. AC joint and glenohumeral joint are preserved. IMPRESSION: Negative. Electronically Signed   By: Lahoma Crocker M.D.   On: 06/28/2016 15:42   Dg Knee Complete 4 Views Left  Result Date: 06/28/2016 CLINICAL DATA:  Left knee pain after fall 1 day prior. EXAM: LEFT KNEE - COMPLETE 4+ VIEW COMPARISON:  None. FINDINGS: There is a large lipohemarthrosis in the suprapatellar left knee joint. There is a lateral left tibial plateau fracture extending to the medial left tibial spine, with probable minimal 2 mm depression of the lateral tibial plateau fracture fragment. No additional fracture. No dislocation. No suspicious focal osseous  lesion. No radiopaque foreign body. IMPRESSION: Lateral left tibial plateau fracture with minimal depression and large lipohemarthrosis. Electronically Signed   By: Ilona Sorrel M.D.   On: 06/28/2016 15:42     PMFS History: Patient Active Problem List   Diagnosis Date Noted  . Closed fracture fibula, head, left, initial encounter 06/29/2016  . Closed displaced fracture of lateral condyle of left tibia 06/29/2016  . [place] DISEASE 09/06/2006  . HYPOTHYROIDISM, UNSPECIFIED 09/06/2006  . ANXIETY 09/06/2006  . TOBACCO DEPENDENCE 09/06/2006  . DEPRESSIVE DISORDER, NOS 09/06/2006  . RHINITIS, ALLERGIC 09/06/2006  . INSOMNIA NOS 09/06/2006   Past Medical History:  Diagnosis Date  . Anxiety   . Graves' disease   . Hypothyroidism    s/p post ablation for Graves Disease  . Thyroid disease     Family History  Problem Relation Age of Onset  . Thyroid disease Mother   . Parkinson's disease Father     Past Surgical History:  Procedure Laterality Date  . EYE SURGERY    . PARATHYROIDECTOMY     Social History   Occupational History  . Visual Artist     Social History Main Topics  . Smoking status: Light Tobacco Smoker    Types: Cigarettes  .  Smokeless tobacco: Never Used  . Alcohol use No     Comment: Quit 1989  . Drug use: No  . Sexual activity: Not on file

## 2016-06-30 ENCOUNTER — Ambulatory Visit (INDEPENDENT_AMBULATORY_CARE_PROVIDER_SITE_OTHER): Payer: Medicare Other | Admitting: Orthopaedic Surgery

## 2016-07-06 ENCOUNTER — Other Ambulatory Visit (INDEPENDENT_AMBULATORY_CARE_PROVIDER_SITE_OTHER): Payer: Self-pay | Admitting: Orthopedic Surgery

## 2016-07-06 MED ORDER — OXYCODONE HCL 5 MG PO TABS: 5.0000 mg | ORAL_TABLET | ORAL | 0 refills | Status: DC | PRN

## 2016-07-07 ENCOUNTER — Telehealth (INDEPENDENT_AMBULATORY_CARE_PROVIDER_SITE_OTHER): Payer: Self-pay | Admitting: Orthopedic Surgery

## 2016-07-07 NOTE — Telephone Encounter (Signed)
We received rx request on fax, and Dr. Sharol Given is going to refill, her husband is going to pick up. Advised patient this is fine needs photo id.

## 2016-07-07 NOTE — Telephone Encounter (Signed)
Patient is requesting refill for oxycodone for broken leg. Cb#: [phone]  Husband will pickup, patient is not mobile. Has 6 left.

## 2016-07-21 ENCOUNTER — Ambulatory Visit (INDEPENDENT_AMBULATORY_CARE_PROVIDER_SITE_OTHER): Payer: Medicare HMO | Admitting: Orthopedic Surgery

## 2016-07-21 ENCOUNTER — Encounter (INDEPENDENT_AMBULATORY_CARE_PROVIDER_SITE_OTHER): Payer: Self-pay | Admitting: Orthopedic Surgery

## 2016-07-21 ENCOUNTER — Ambulatory Visit (INDEPENDENT_AMBULATORY_CARE_PROVIDER_SITE_OTHER): Payer: Medicare HMO

## 2016-07-21 DIAGNOSIS — S82832D Other fracture of upper and lower end of left fibula, subsequent encounter for closed fracture with routine healing: Secondary | ICD-10-CM

## 2016-07-21 DIAGNOSIS — S82122D Displaced fracture of lateral condyle of left tibia, subsequent encounter for closed fracture with routine healing: Secondary | ICD-10-CM

## 2016-07-21 MED ORDER — HYDROCODONE-ACETAMINOPHEN 5-325 MG PO TABS: 1.0000 | ORAL_TABLET | Freq: Four times a day (QID) | ORAL | 0 refills | Status: DC | PRN

## 2016-07-21 NOTE — Progress Notes (Signed)
   Post-Op Visit Note   Patient: Rachael Villanueva           Date of Birth: [birth year]           MRN: [id number] Visit Date: 07/21/2016 PCP: Orpah Melter, MD   Assessment & Plan:  Chief Complaint:  Chief Complaint  Patient presents with  . Left Knee - Follow-up, Fracture   Visit Diagnoses:  1. Traumatic closed nondisplaced fracture of distal fibula, left, with routine healing, subsequent encounter   2. Closed displaced fracture of lateral condyle of left tibia with routine healing, subsequent encounter     Plan: Satori is a 66 year old female with tibial plateau fracture on the left.  She also has a fibular head fracture.  She's been doing well.  She's been nonweightbearing in an immobilizer.  She's been taking an aspirin a day.  On exam she has no effusion she has very good stability to varus and valgus stress at 0 and 30.  Radiographs show no change in fracture alignment.  No calf tenderness present.  Negative Bevelyn Buckles is present.  Plan is to DC the knee immobilizer start knee range of motion exercises.  Continue with aspirin a day.  I can going to have hydrocodone for her pain medicine.  Follow-up in 4 weeks repeat radiographs and likely initiation of weightbearing and physical therapy at that time  Follow-Up Instructions: Return in about 4 weeks (around 08/16/2016).   Orders:  Orders Placed This Encounter  Procedures  . XR Knee 1-2 Views Left   Meds ordered this encounter  Medications  . HYDROcodone-acetaminophen (NORCO/VICODIN) 5-325 MG tablet    Sig: Take 1 tablet by mouth every 6 (six) hours as needed for moderate pain.    Dispense:  45 tablet    Refill:  0    Imaging: Xr Knee 1-2 Views Left  Result Date: 07/21/2016 AP lateral left knee reviewed.  Fracture healing is occurring.  There is been no further displacement of the lateral tibial plateau fracture.  Patella relation to the distal femur intact.  No effusion in the knee is present.   PMFS History: Patient Active  Problem List   Diagnosis Date Noted  . Closed fracture fibula, head, left, initial encounter 06/29/2016  . Closed displaced fracture of lateral condyle of left tibia 06/29/2016  . [place] DISEASE 09/06/2006  . HYPOTHYROIDISM, UNSPECIFIED 09/06/2006  . ANXIETY 09/06/2006  . TOBACCO DEPENDENCE 09/06/2006  . DEPRESSIVE DISORDER, NOS 09/06/2006  . RHINITIS, ALLERGIC 09/06/2006  . INSOMNIA NOS 09/06/2006   Past Medical History:  Diagnosis Date  . Anxiety   . Graves' disease   . Hypothyroidism    s/p post ablation for Graves Disease  . Thyroid disease     Family History  Problem Relation Age of Onset  . Thyroid disease Mother   . Parkinson's disease Father     Past Surgical History:  Procedure Laterality Date  . EYE SURGERY    . PARATHYROIDECTOMY     Social History   Occupational History  . Visual Artist     Social History Main Topics  . Smoking status: Light Tobacco Smoker    Types: Cigarettes  . Smokeless tobacco: Never Used  . Alcohol use No     Comment: Quit 1989  . Drug use: No  . Sexual activity: Not on file

## 2016-08-03 ENCOUNTER — Ambulatory Visit (HOSPITAL_BASED_OUTPATIENT_CLINIC_OR_DEPARTMENT_OTHER)
Admission: RE | Admit: 2016-08-03 | Discharge: 2016-08-03 | Disposition: A | Discharge status: Home / Self Care | Payer: Medicare HMO | Source: Ambulatory Visit | Attending: Family Medicine | Admitting: Family Medicine

## 2016-08-03 ENCOUNTER — Encounter (HOSPITAL_COMMUNITY): Payer: Self-pay

## 2016-08-03 ENCOUNTER — Encounter (INDEPENDENT_AMBULATORY_CARE_PROVIDER_SITE_OTHER): Payer: Self-pay | Admitting: Orthopedic Surgery

## 2016-08-03 ENCOUNTER — Other Ambulatory Visit (HOSPITAL_COMMUNITY)
Admission: RE | Admit: 2016-08-03 | Discharge: 2016-08-03 | Disposition: A | Discharge status: Home / Self Care | Payer: Medicare HMO | Source: Ambulatory Visit | Attending: Orthopedic Surgery | Admitting: Orthopedic Surgery

## 2016-08-03 ENCOUNTER — Ambulatory Visit (INDEPENDENT_AMBULATORY_CARE_PROVIDER_SITE_OTHER): Payer: Medicare HMO | Admitting: Orthopedic Surgery

## 2016-08-03 ENCOUNTER — Ambulatory Visit (HOSPITAL_COMMUNITY)
Admission: RE | Admit: 2016-08-03 | Discharge: 2016-08-03 | Disposition: A | Discharge status: Home / Self Care | Payer: Medicare HMO | Source: Ambulatory Visit | Attending: Orthopedic Surgery | Admitting: Orthopedic Surgery

## 2016-08-03 ENCOUNTER — Telehealth (INDEPENDENT_AMBULATORY_CARE_PROVIDER_SITE_OTHER): Payer: Self-pay

## 2016-08-03 DIAGNOSIS — M79605 Pain in left leg: Secondary | ICD-10-CM | POA: Diagnosis not present

## 2016-08-03 DIAGNOSIS — R0789 Other chest pain: Secondary | ICD-10-CM | POA: Insufficient documentation

## 2016-08-03 DIAGNOSIS — R0602 Shortness of breath: Secondary | ICD-10-CM | POA: Diagnosis not present

## 2016-08-03 DIAGNOSIS — M7989 Other specified soft tissue disorders: Secondary | ICD-10-CM

## 2016-08-03 DIAGNOSIS — R911 Solitary pulmonary nodule: Secondary | ICD-10-CM | POA: Diagnosis not present

## 2016-08-03 DIAGNOSIS — M25512 Pain in left shoulder: Secondary | ICD-10-CM | POA: Diagnosis present

## 2016-08-03 LAB — COMPREHENSIVE METABOLIC PANEL
ALBUMIN: 4.8 g/dL (ref 3.5–5.0)
ALK PHOS: 178 U/L — AB (ref 38–126)
ALT: 17 U/L (ref 14–54)
ANION GAP: 7 (ref 5–15)
AST: 20 U/L (ref 15–41)
BUN: 12 mg/dL (ref 6–20)
CALCIUM: 9.6 mg/dL (ref 8.9–10.3)
CO2: 28 mmol/L (ref 22–32)
Chloride: 103 mmol/L (ref 101–111)
Creatinine, Ser: 0.8 mg/dL (ref 0.44–1.00)
GFR calc Af Amer: >60 mL/min (ref >60)
GFR calc non Af Amer: >60 mL/min (ref >60)
GLUCOSE: 106 mg/dL — AB (ref 65–99)
Potassium: 3.9 mmol/L (ref 3.5–5.1)
Sodium: 138 mmol/L (ref 135–145)
TOTAL PROTEIN: 7.3 g/dL (ref 6.5–8.1)
Total Bilirubin: 0.4 mg/dL (ref 0.3–1.2)

## 2016-08-03 LAB — CBC WITH DIFFERENTIAL/PLATELET
BASOS PCT: 0 %
Basophils Absolute: 0 10*3/uL (ref 0.0–0.1)
Eosinophils Absolute: 0.1 10*3/uL (ref 0.0–0.7)
Eosinophils Relative: 1 %
HCT: 42 % (ref 36.0–46.0)
HEMOGLOBIN: 14.1 g/dL (ref 12.0–15.0)
Lymphocytes Relative: 25 %
Lymphs Abs: 2.4 10*3/uL (ref 0.7–4.0)
MCH: 30.5 pg (ref 26.0–34.0)
MCHC: 33.6 g/dL (ref 30.0–36.0)
MCV: 90.9 fL (ref 78.0–100.0)
MONOS PCT: 3 %
Monocytes Absolute: 0.3 10*3/uL (ref 0.1–1.0)
NEUTROS ABS: 6.7 10*3/uL (ref 1.7–7.7)
NEUTROS PCT: 71 %
Platelets: 261 10*3/uL (ref 150–400)
RBC: 4.62 MIL/uL (ref 3.87–5.11)
RDW: 14 % (ref 11.5–15.5)
WBC: 9.5 10*3/uL (ref 4.0–10.5)

## 2016-08-03 MED ORDER — IOPAMIDOL (ISOVUE-370) INJECTION 76%
INTRAVENOUS | Status: AC
  Filled 2016-08-03: qty 100

## 2016-08-03 MED ORDER — IOPAMIDOL (ISOVUE-370) INJECTION 76%
100.0000 mL | Freq: Once | INTRAVENOUS | Status: AC | PRN
  Administered 2016-08-03: 100 mL via INTRAVENOUS

## 2016-08-03 MED ORDER — OXYCODONE HCL 5 MG PO TABS: 5.0000 mg | ORAL_TABLET | Freq: Three times a day (TID) | ORAL | 0 refills | Status: DC | PRN

## 2016-08-03 NOTE — Addendum Note (Signed)
Addended byBrand Males on: 08/03/2016 10:47 AM   Modules accepted: Orders

## 2016-08-03 NOTE — Addendum Note (Signed)
Addended by: Daylene Posey T on: 08/03/2016 11:04 AM   Modules accepted: Orders

## 2016-08-03 NOTE — Telephone Encounter (Signed)
Stat call report from radiology  For CT angio chest. There is a 30m slightly spiculated nodule left upper lobe. No contrast chest CT 6-12 recommended. If nodule stable then repeat in 18-24 months. They are faxing a report. This is just [place]

## 2016-08-03 NOTE — Addendum Note (Signed)
Addended byBrand Males on: 08/03/2016 10:16 AM   Modules accepted: Orders

## 2016-08-03 NOTE — Addendum Note (Signed)
Addended by: Marcene Duos on: 08/03/2016 10:19 AM   Modules accepted: Orders

## 2016-08-03 NOTE — Progress Notes (Signed)
*  Preliminary Results* Left lower extremity venous duplex completed. Left lower extremity is negative for deep vein thrombosis. There is no evidence of left Baker's cyst.  08/03/2016 12:46 PM  Maudry Mayhew, BS, RVT, RDCS, RDMS

## 2016-08-03 NOTE — Progress Notes (Signed)
   Post-Op Visit Note   Patient: Rachael Villanueva           Date of Birth: [birth year]           MRN: [id number] Visit Date: 08/03/2016 PCP: Orpah Melter, MD   Assessment & Plan:  Chief Complaint:  Chief Complaint  Patient presents with  . Chest - Pain  . Left Shoulder - Pain   Visit Diagnoses:  1. Other chest pain   2. Acute pain of left shoulder     Plan: Patient is now about 5 weeks out left tibial plateau fracture.  She describes acute onset of left-sided axillary and anterior chest wall pain yesterday.  Old x-rays are reviewed and she had negative rib films and negative left shoulder x-rays at the time of her injury on 06/28/2016.  On exam she has full active and passive range of motion left shoulder she has improving range of motion of the left knee from almost full extension to past 90 of flexion.  No calf or thigh tenderness on the left-hand side.  Plan at this time is to TR arthrogram of the chest to rule out pulmonary embolism and ultrasound left leg to rule out DVT.  Follow-Up Instructions: No Follow-up on file.   Orders:  No orders of the defined types were placed in this encounter.  No orders of the defined types were placed in this encounter.   Imaging: No results found.  PMFS History: Patient Active Problem List   Diagnosis Date Noted  . Other chest pain 08/03/2016  . Acute pain of left shoulder 08/03/2016  . Closed fracture fibula, head, left, initial encounter 06/29/2016  . Closed displaced fracture of lateral condyle of left tibia 06/29/2016  . [place] DISEASE 09/06/2006  . HYPOTHYROIDISM, UNSPECIFIED 09/06/2006  . ANXIETY 09/06/2006  . TOBACCO DEPENDENCE 09/06/2006  . DEPRESSIVE DISORDER, NOS 09/06/2006  . RHINITIS, ALLERGIC 09/06/2006  . INSOMNIA NOS 09/06/2006   Past Medical History:  Diagnosis Date  . Anxiety   . Graves' disease   . Hypothyroidism    s/p post ablation for Graves Disease  . Thyroid disease     Family History  Problem  Relation Age of Onset  . Thyroid disease Mother   . Parkinson's disease Father     Past Surgical History:  Procedure Laterality Date  . EYE SURGERY    . PARATHYROIDECTOMY     Social History   Occupational History  . Visual Artist     Social History Main Topics  . Smoking status: Light Tobacco Smoker    Types: Cigarettes  . Smokeless tobacco: Never Used  . Alcohol use No     Comment: Quit 1989  . Drug use: No  . Sexual activity: Not on file

## 2016-08-18 ENCOUNTER — Ambulatory Visit (INDEPENDENT_AMBULATORY_CARE_PROVIDER_SITE_OTHER): Payer: Medicare HMO | Admitting: Orthopedic Surgery

## 2016-08-18 ENCOUNTER — Ambulatory Visit (INDEPENDENT_AMBULATORY_CARE_PROVIDER_SITE_OTHER): Payer: Medicare HMO

## 2016-08-18 ENCOUNTER — Encounter (INDEPENDENT_AMBULATORY_CARE_PROVIDER_SITE_OTHER): Payer: Self-pay | Admitting: Orthopedic Surgery

## 2016-08-18 DIAGNOSIS — S82122D Displaced fracture of lateral condyle of left tibia, subsequent encounter for closed fracture with routine healing: Secondary | ICD-10-CM | POA: Diagnosis not present

## 2016-08-18 DIAGNOSIS — S82832A Other fracture of upper and lower end of left fibula, initial encounter for closed fracture: Secondary | ICD-10-CM

## 2016-08-18 MED ORDER — OXYCODONE HCL 5 MG PO TABS: 5.0000 mg | ORAL_TABLET | Freq: Two times a day (BID) | ORAL | 0 refills | Status: DC | PRN

## 2016-08-18 NOTE — Progress Notes (Signed)
   Post-Op Visit Note   Patient: Rachael Villanueva           Date of Birth: [birth year]           MRN: [id number] Visit Date: 08/18/2016 PCP: Orpah Melter, MD   Assessment & Plan:  Chief Complaint:  Chief Complaint  Patient presents with  . Left Knee - Fracture, Follow-up   Visit Diagnoses:  1. Closed fracture fibula, head, left, initial encounter   2. Closed displaced fracture of lateral condyle of left tibia with routine healing, subsequent encounter     Plan: Rachael Villanueva is now 6 weeks out left tibial plateau fracture.  Doing reasonably well.  She is walking in today with no assistive devices.  Radiographs do show about 1-2 mm of depression in the joint surface compared to radiographs from a month ago.  We'll put her back on partial weightbearing for 3 weeks and then tried to resume weightbearing.  She is having difficulty with crutches so we will go with a walker.  Encouraged her to do stationary biking for resistance to improve quad strength.  Refilled Percocet that a lesser dose.  I'll see her back in 4 weeks with repeat radiographs standing on return  Follow-Up Instructions: No Follow-up on file.   Orders:  Orders Placed This Encounter  Procedures  . XR Knee 1-2 Views Left   No orders of the defined types were placed in this encounter.   Imaging: Xr Knee 1-2 Views Left  Result Date: 08/18/2016 AP lateral left knee reviewed.  Some interval callus formation around the lateral tibial plateau is noted.  Fibular head fracture also appears nondisplaced.  There is slight depression of the articular surface noted on the AP.  This is less than 2-3 mm.   PMFS History: Patient Active Problem List   Diagnosis Date Noted  . Other chest pain 08/03/2016  . Acute pain of left shoulder 08/03/2016  . Closed fracture fibula, head, left, initial encounter 06/29/2016  . Closed displaced fracture of lateral condyle of left tibia 06/29/2016  . [place] DISEASE 09/06/2006  . HYPOTHYROIDISM,  UNSPECIFIED 09/06/2006  . ANXIETY 09/06/2006  . TOBACCO DEPENDENCE 09/06/2006  . DEPRESSIVE DISORDER, NOS 09/06/2006  . RHINITIS, ALLERGIC 09/06/2006  . INSOMNIA NOS 09/06/2006   Past Medical History:  Diagnosis Date  . Anxiety   . Graves' disease   . Hypothyroidism    s/p post ablation for Graves Disease  . Thyroid disease     Family History  Problem Relation Age of Onset  . Thyroid disease Mother   . Parkinson's disease Father     Past Surgical History:  Procedure Laterality Date  . EYE SURGERY    . PARATHYROIDECTOMY     Social History   Occupational History  . Visual Artist     Social History Main Topics  . Smoking status: Light Tobacco Smoker    Types: Cigarettes  . Smokeless tobacco: Never Used  . Alcohol use No     Comment: Quit 1989  . Drug use: No  . Sexual activity: Not on file

## 2016-08-29 ENCOUNTER — Telehealth (INDEPENDENT_AMBULATORY_CARE_PROVIDER_SITE_OTHER): Payer: Self-pay | Admitting: *Deleted

## 2016-08-29 NOTE — Telephone Encounter (Signed)
Called patient left message on voicemail to return call to R/S appointment to 09/15/16 or later on Dr. Randel Pigg schedule.

## 2016-08-29 NOTE — Telephone Encounter (Signed)
Will you call patient and move her out to 09/15/16 or later? On Dean's schedule?  Thanks-

## 2016-08-29 NOTE — Telephone Encounter (Signed)
Patient called in this afternoon in regards to needing to know if it was okay to come in on March 5th or if that was to soon? She was worried that it would be to early for her to come back for a four week follow up. Thank you Her CB # [phone].

## 2016-09-11 ENCOUNTER — Ambulatory Visit (INDEPENDENT_AMBULATORY_CARE_PROVIDER_SITE_OTHER): Payer: Medicare HMO | Admitting: Orthopedic Surgery

## 2016-09-15 ENCOUNTER — Ambulatory Visit (INDEPENDENT_AMBULATORY_CARE_PROVIDER_SITE_OTHER): Payer: Medicare HMO | Admitting: Orthopedic Surgery

## 2016-09-18 ENCOUNTER — Ambulatory Visit (INDEPENDENT_AMBULATORY_CARE_PROVIDER_SITE_OTHER): Payer: Medicare HMO | Admitting: Orthopedic Surgery

## 2016-09-25 ENCOUNTER — Ambulatory Visit (INDEPENDENT_AMBULATORY_CARE_PROVIDER_SITE_OTHER): Payer: Medicare HMO | Admitting: Orthopedic Surgery

## 2016-09-25 ENCOUNTER — Ambulatory Visit (INDEPENDENT_AMBULATORY_CARE_PROVIDER_SITE_OTHER): Payer: Medicare HMO

## 2016-09-25 ENCOUNTER — Encounter (INDEPENDENT_AMBULATORY_CARE_PROVIDER_SITE_OTHER): Payer: Self-pay | Admitting: Orthopedic Surgery

## 2016-09-25 DIAGNOSIS — M25562 Pain in left knee: Secondary | ICD-10-CM

## 2016-09-25 DIAGNOSIS — S82122D Displaced fracture of lateral condyle of left tibia, subsequent encounter for closed fracture with routine healing: Secondary | ICD-10-CM

## 2016-09-25 NOTE — Progress Notes (Signed)
   Post-Op Visit Note   Patient: Rachael Villanueva           Date of Birth: 21-Jul-1950           MRN: [id number] Visit Date: 09/25/2016 PCP: Orpah Melter, MD   Assessment & Plan:  Chief Complaint:  Chief Complaint  Patient presents with  . Left Knee - Follow-up   Visit Diagnoses:  1. Left knee pain, unspecified chronicity   2. Closed displaced fracture of lateral condyle of left tibia with routine healing, subsequent encounter     Plan: Rachael Villanueva is a 66 year old patient follow-up left knee tibial plateau fracture doing some better.  On exam she has more normal-appearing gait and walk.  She has good range of motion and no effusion on the left-hand side.  Quad strength is improving.  Radiographs show no real change in fracture alignment.  Plan at this time is to continue with gradually increasing activities.  She is going to have to get that leg stronger in order to increase the envelope of pain-free function.  I'll see her back as needed  Follow-Up Instructions: No Follow-up on file.   Orders:  Orders Placed This Encounter  Procedures  . XR Knee 1-2 Views Left   No orders of the defined types were placed in this encounter.   Imaging: Xr Knee 1-2 Views Left  Result Date: 09/25/2016  2 view left knee AP lateral reviewed.  Lateral plateau fracture again noted with 1-2 mm of depression of the lateral articular surface.  Bones appear osteopenic.  Some fracture callus formation is noted on the AP view.  Patella centrally located   PMFS History: Patient Active Problem List   Diagnosis Date Noted  . Other chest pain 08/03/2016  . Acute pain of left shoulder 08/03/2016  . Closed fracture fibula, head, left, initial encounter 06/29/2016  . Closed displaced fracture of lateral condyle of left tibia 06/29/2016  . [place] DISEASE 09/06/2006  . HYPOTHYROIDISM, UNSPECIFIED 09/06/2006  . ANXIETY 09/06/2006  . TOBACCO DEPENDENCE 09/06/2006  . DEPRESSIVE DISORDER, NOS 09/06/2006  .  RHINITIS, ALLERGIC 09/06/2006  . INSOMNIA NOS 09/06/2006   Past Medical History:  Diagnosis Date  . Anxiety   . Graves' disease   . Hypothyroidism    s/p post ablation for Graves Disease  . Thyroid disease     Family History  Problem Relation Age of Onset  . Thyroid disease Mother   . Parkinson's disease Father     Past Surgical History:  Procedure Laterality Date  . EYE SURGERY    . PARATHYROIDECTOMY     Social History   Occupational History  . Visual Artist     Social History Main Topics  . Smoking status: Light Tobacco Smoker    Types: Cigarettes  . Smokeless tobacco: Never Used  . Alcohol use No     Comment: Quit 1989  . Drug use: No  . Sexual activity: Not on file

## 2016-10-13 DIAGNOSIS — E559 Vitamin D deficiency, unspecified: Secondary | ICD-10-CM | POA: Diagnosis not present

## 2016-10-13 DIAGNOSIS — R5383 Other fatigue: Secondary | ICD-10-CM | POA: Diagnosis not present

## 2016-10-13 DIAGNOSIS — R42 Dizziness and giddiness: Secondary | ICD-10-CM | POA: Diagnosis not present

## 2016-10-13 DIAGNOSIS — E039 Hypothyroidism, unspecified: Secondary | ICD-10-CM | POA: Diagnosis not present

## 2016-10-13 DIAGNOSIS — Z9889 Other specified postprocedural states: Secondary | ICD-10-CM | POA: Diagnosis not present

## 2016-10-13 DIAGNOSIS — R748 Abnormal levels of other serum enzymes: Secondary | ICD-10-CM | POA: Diagnosis not present

## 2016-11-14 ENCOUNTER — Telehealth (INDEPENDENT_AMBULATORY_CARE_PROVIDER_SITE_OTHER): Payer: Self-pay | Admitting: Orthopedic Surgery

## 2016-11-14 NOTE — Telephone Encounter (Signed)
Copy of records mailed to patient

## 2016-11-20 DIAGNOSIS — R748 Abnormal levels of other serum enzymes: Secondary | ICD-10-CM | POA: Diagnosis not present

## 2016-11-20 DIAGNOSIS — E559 Vitamin D deficiency, unspecified: Secondary | ICD-10-CM | POA: Diagnosis not present

## 2016-11-20 DIAGNOSIS — Z9889 Other specified postprocedural states: Secondary | ICD-10-CM | POA: Diagnosis not present

## 2016-11-20 DIAGNOSIS — E039 Hypothyroidism, unspecified: Secondary | ICD-10-CM | POA: Diagnosis not present

## 2017-04-23 DIAGNOSIS — Z9889 Other specified postprocedural states: Secondary | ICD-10-CM | POA: Diagnosis not present

## 2017-04-23 DIAGNOSIS — R5383 Other fatigue: Secondary | ICD-10-CM | POA: Diagnosis not present

## 2017-04-23 DIAGNOSIS — Z0000 Encounter for general adult medical examination without abnormal findings: Secondary | ICD-10-CM | POA: Diagnosis not present

## 2017-04-23 DIAGNOSIS — Z23 Encounter for immunization: Secondary | ICD-10-CM | POA: Diagnosis not present

## 2017-04-23 DIAGNOSIS — Z1211 Encounter for screening for malignant neoplasm of colon: Secondary | ICD-10-CM | POA: Diagnosis not present

## 2017-04-23 DIAGNOSIS — Z1159 Encounter for screening for other viral diseases: Secondary | ICD-10-CM | POA: Diagnosis not present

## 2017-04-23 DIAGNOSIS — R9389 Abnormal findings on diagnostic imaging of other specified body structures: Secondary | ICD-10-CM | POA: Diagnosis not present

## 2017-04-23 DIAGNOSIS — Z1231 Encounter for screening mammogram for malignant neoplasm of breast: Secondary | ICD-10-CM | POA: Diagnosis not present

## 2017-04-23 DIAGNOSIS — E039 Hypothyroidism, unspecified: Secondary | ICD-10-CM | POA: Diagnosis not present

## 2017-04-23 DIAGNOSIS — E559 Vitamin D deficiency, unspecified: Secondary | ICD-10-CM | POA: Diagnosis not present

## 2017-04-23 DIAGNOSIS — Z13820 Encounter for screening for osteoporosis: Secondary | ICD-10-CM | POA: Diagnosis not present

## 2017-04-30 DIAGNOSIS — E559 Vitamin D deficiency, unspecified: Secondary | ICD-10-CM | POA: Diagnosis not present

## 2017-04-30 DIAGNOSIS — R768 Other specified abnormal immunological findings in serum: Secondary | ICD-10-CM | POA: Diagnosis not present

## 2017-04-30 DIAGNOSIS — E039 Hypothyroidism, unspecified: Secondary | ICD-10-CM | POA: Diagnosis not present

## 2017-04-30 DIAGNOSIS — Z7901 Long term (current) use of anticoagulants: Secondary | ICD-10-CM | POA: Diagnosis not present

## 2017-05-10 ENCOUNTER — Other Ambulatory Visit: Payer: Self-pay | Admitting: Family Medicine

## 2017-05-10 DIAGNOSIS — R911 Solitary pulmonary nodule: Secondary | ICD-10-CM

## 2017-05-22 ENCOUNTER — Encounter (HOSPITAL_COMMUNITY): Payer: Self-pay

## 2017-05-22 ENCOUNTER — Ambulatory Visit (HOSPITAL_COMMUNITY)
Admission: RE | Admit: 2017-05-22 | Discharge: 2017-05-22 | Disposition: A | Discharge status: Home / Self Care | Payer: Medicare HMO | Source: Ambulatory Visit | Attending: Family Medicine | Admitting: Family Medicine

## 2017-05-22 DIAGNOSIS — I700 Atherosclerosis of aorta: Secondary | ICD-10-CM | POA: Insufficient documentation

## 2017-05-22 DIAGNOSIS — J439 Emphysema, unspecified: Secondary | ICD-10-CM | POA: Insufficient documentation

## 2017-05-22 DIAGNOSIS — R911 Solitary pulmonary nodule: Secondary | ICD-10-CM | POA: Diagnosis not present

## 2017-06-05 ENCOUNTER — Other Ambulatory Visit: Payer: Self-pay | Admitting: Family Medicine

## 2017-06-05 DIAGNOSIS — R911 Solitary pulmonary nodule: Secondary | ICD-10-CM

## 2017-06-25 DIAGNOSIS — E039 Hypothyroidism, unspecified: Secondary | ICD-10-CM | POA: Diagnosis not present

## 2017-06-26 ENCOUNTER — Ambulatory Visit (HOSPITAL_COMMUNITY)
Admission: RE | Admit: 2017-06-26 | Discharge: 2017-06-26 | Disposition: A | Discharge status: Home / Self Care | Payer: Medicare HMO | Source: Ambulatory Visit | Attending: Family Medicine | Admitting: Family Medicine

## 2017-06-26 DIAGNOSIS — I700 Atherosclerosis of aorta: Secondary | ICD-10-CM | POA: Diagnosis not present

## 2017-06-26 DIAGNOSIS — I2510 Atherosclerotic heart disease of native coronary artery without angina pectoris: Secondary | ICD-10-CM | POA: Diagnosis not present

## 2017-06-26 DIAGNOSIS — J329 Chronic sinusitis, unspecified: Secondary | ICD-10-CM | POA: Insufficient documentation

## 2017-06-26 DIAGNOSIS — R911 Solitary pulmonary nodule: Secondary | ICD-10-CM | POA: Diagnosis not present

## 2017-06-26 LAB — GLUCOSE, CAPILLARY: Glucose-Capillary: 92 mg/dL (ref 65–99)

## 2017-06-26 MED ORDER — FLUDEOXYGLUCOSE F - 18 (FDG) INJECTION
6.5000 | Freq: Once | INTRAVENOUS | Status: AC | PRN
  Administered 2017-06-26: 6.5 via INTRAVENOUS

## 2017-07-23 ENCOUNTER — Encounter: Payer: Self-pay | Admitting: Thoracic Surgery (Cardiothoracic Vascular Surgery)

## 2017-07-23 ENCOUNTER — Institutional Professional Consult (permissible substitution): Payer: Medicare HMO | Admitting: Thoracic Surgery (Cardiothoracic Vascular Surgery)

## 2017-07-23 DIAGNOSIS — R911 Solitary pulmonary nodule: Secondary | ICD-10-CM | POA: Insufficient documentation

## 2017-07-23 NOTE — Progress Notes (Signed)
PCP is Glenford Bayley, DO Referring Provider is Glenford Bayley, DO  Chief Complaint  Patient presents with  . Lung Lesion    Surgical eval, Chest CT 05/22/17, PET Scan 06/26/17    HPI: Rachael Villanueva sent for consultation regarding a left upper lobe lung nodule.  Rachael Villanueva is a 67 year old woman with a past medical history significant for hypothyroidism status post ablation for Graves' disease, anxiety, hyperparathyroidism treated with parathyroidectomy, tobacco abuse, and emphysema.  She is vague about her smoking history but has smoked at least half pack of cigarettes for 50 years but at times has smoked more than that.  In December 2017 she fractured her knee.  In January 2018 she was having shoulder pain.  A CT angiogram of the chest was done to rule out pulmonary embolus.  That showed a 5.8 mm left upper lobe groundglass opacity.  She recently saw Dr. Marin Comment.  A repeat CT was done to follow-up the nodule.  The nodule had increased in size to 8 mm and was more solid in appearance.  A PET/CT showed the nodule was hypermetabolic with an SUV of 2.0.   She has been feeling well lately.  She denies loss of appetite but her husband thinks that she is eating less.  She is lost about 7 pounds over the past 3 months.  She denies chest pain, pressure, tightness, or shortness of breath with activity.  She denies any unusual cough or wheezing.  She has not had any unusual headaches or visual changes.  Zubrod Score: At the time of surgery this patient's most appropriate activity status/level should be described as: [x]     0    Normal activity, no symptoms []     1    Restricted in physical strenuous activity but ambulatory, able to do out light work []     2    Ambulatory and capable of self care, unable to do work activities, up and about >50 % of waking hours                              []     3    Only limited self care, in bed greater than 50% of waking hours []     4    Completely disabled, no self care,  confined to bed or chair []     5    Moribund  Past Medical History:  Diagnosis Date  . Anxiety   . Graves' disease   . Hypercalcemia   . Hypothyroidism    s/p post ablation for Graves Disease  . Lung nodule   . Pulmonary emphysema ([place])   . Sleep disorder   . Thyroid disease   . Vitamin D deficiency     Past Surgical History:  Procedure Laterality Date  . ABLATION     for graves dX  . EYE SURGERY    . PARATHYROIDECTOMY      Family History  Problem Relation Age of Onset  . Thyroid disease Mother   . Lung cancer Mother   . Parkinson's disease Father   . Thyroid disease Brother     Social History Social History   Tobacco Use  . Smoking status: Light Tobacco Smoker    Types: Cigarettes  . Smokeless tobacco: Never Used  Substance Use Topics  . Alcohol use: No    Alcohol/week: 0.0 oz    Comment: Quit 1989  . Drug use: No  Current Outpatient Medications  Medication Sig Dispense Refill  . ALPRAZolam (XANAX) 0.5 MG tablet Take 0.5 mg by mouth at bedtime as needed for anxiety.    . Armodafinil 250 MG tablet Take 250 mg by mouth daily.    . chlorhexidine (PERIDEX) 0.12 % solution Use as directed 10 mLs in the mouth or throat.   98  . ergocalciferol (VITAMIN D2) 50000 units capsule Take 50,000 Units by mouth once a week.    . levothyroxine (SYNTHROID, LEVOTHROID) 125 MCG tablet 112 mcg.   0  . traZODone (DESYREL) 50 MG tablet Take 50 mg by mouth at bedtime as needed for sleep.   0  . vortioxetine HBr (TRINTELLIX) 20 MG TABS Take 20 mg by mouth once.     No current facility-administered medications for this visit.     No Known Allergies  Review of Systems  Constitutional: Positive for appetite change and unexpected weight change. Negative for chills and fever.       Sleep disorder  HENT: Negative for trouble swallowing and voice change.   Respiratory: Negative for cough, chest tightness, shortness of breath and wheezing.   Cardiovascular: Negative for chest  pain, palpitations and leg swelling.  Gastrointestinal: Positive for constipation. Negative for abdominal pain.  Genitourinary: Negative for difficulty urinating and dysuria.  Musculoskeletal: Negative for arthralgias and back pain.  Neurological: Negative for seizures and syncope.  Hematological: Negative for adenopathy. Does not bruise/bleed easily.  Psychiatric/Behavioral: The patient is nervous/anxious.   All other systems reviewed and are negative.   BP (!) 150/83   Pulse 90   Resp 20   Ht 4' 11.5" (1.511 m)   Wt 113 lb (51.3 kg)   SpO2 98% Comment: RA  BMI 22.44 kg/m  Physical Exam  Constitutional: She is oriented to person, place, and time. She appears well-developed and well-nourished. No distress.  HENT:  Head: Normocephalic and atraumatic.  Mouth/Throat: No oropharyngeal exudate.  Eyes: Conjunctivae and EOM are normal. No scleral icterus.  Neck: Neck supple. No thyromegaly present.  Cardiovascular: Normal rate, regular rhythm, normal heart sounds and intact distal pulses. Exam reveals no gallop and no friction rub.  No murmur heard. Pulmonary/Chest: Effort normal and breath sounds normal. No respiratory distress. She has no wheezes. She has no rales.  Abdominal: Soft. She exhibits no distension.  Musculoskeletal: She exhibits no edema.  Lymphadenopathy:    She has no cervical adenopathy.  Neurological: She is alert and oriented to person, place, and time. No cranial nerve deficit. She exhibits normal muscle tone. Coordination normal.  Skin: Skin is warm and dry.  Psychiatric:  Extremely anxious  Vitals reviewed.    Diagnostic Tests: CT CHEST WITHOUT CONTRAST  TECHNIQUE: Multidetector CT imaging of the chest was performed following the standard protocol without IV contrast.  COMPARISON:  08/03/2016  FINDINGS: Cardiovascular: Limited evaluation without intravenous contrast. Atherosclerotic calcifications. No aneurysmal dilatation. Normal heart size. No  large pericardial effusion  Mediastinum/Nodes: Midline trachea. Diminutive or surgically absent thyroid. Esophagus within normal limits. Stable prominent left precarinal node measuring 11 mm  Lungs/Pleura: Minimal emphysema. No consolidation or effusion. Re- demonstrated left upper lobe pulmonary nodule, now appears more solid and measures 8 x 9 mm, average diameter of 9 mm. This is seen on series 5, image number 67. Nodule is spiculated.  Upper Abdomen: No acute abnormality.  Musculoskeletal: Stable osseous structures with mild superior endplate deformity at Z56 and C7. Ankylosis T4 through T6.  IMPRESSION: 1. Interval increase in size of left  upper lobe pulmonary nodule, now measuring 9 mm in diameter, also appears more solid and spiculated. Given interval change in appearance and growth, findings would be suspicious for small lung carcinoma. Could consider correlation with PET-CT or tissue sampling. 2. Minimal emphysema These results will be called to the ordering clinician or representative by the Radiologist Assistant, and communication documented in the PACS or zVision Dashboard.  Aortic Atherosclerosis (ICD10-I70.0) and Emphysema (ICD10-J43.9).  Electronically Signed: By: Donavan Foil M.D. On: 05/22/2017 21:18 NUCLEAR MEDICINE PET SKULL BASE TO THIGH  TECHNIQUE: 6.5 mCi F-18 FDG was injected intravenously. Full-ring PET imaging was performed from the skull base to thigh after the radiotracer. CT data was obtained and used for attenuation correction and anatomic localization.  FASTING BLOOD GLUCOSE:  Value: 92 mg/dl  COMPARISON:  05/22/2017 and 08/03/2016  FINDINGS: NECK: No areas of abnormal hypermetabolism. No cervical adenopathy. Mucosal thickening of right maxillary sinus and sphenoid sinus.  CHEST: Mild hypermetabolism corresponding to a spiculated left upper lobe pulmonary nodule. This measures 8 x 8 mm and a S.U.V. max of 2.0 on image  29/series 8.  No thoracic nodal hypermetabolism. LAD versus left main coronary artery atherosclerosis on image 71/series 4.  ABDOMEN/PELVIS: No abdominopelvic parenchymal or nodal hypermetabolism. Normal adrenal glands. Abdominal aortic atherosclerosis. Colonic stool burden suggests constipation.  SKELETON: No suspicious osseous hypermetabolism. There is low-level hypermetabolism about the left shoulder which is likely degenerative. Note is made of L4-5 grade 1-2 anterolisthesis. Mild osteopenia.  IMPRESSION: 1. Low-level hypermetabolism, corresponding to the similar-sized spiculated left upper lobe pulmonary nodule. This level of hypermetabolism, given small nodule size, is suspicious for primary bronchogenic carcinoma. Consider tissue sampling. If sampling is not performed, recommend CT followup at 3 months. 2. No evidence of hypermetabolic thoracic nodal or extrathoracic disease. 3. Coronary artery atherosclerosis. Aortic Atherosclerosis (ICD10-I70.0). 4. Sinus disease.   Electronically Signed   By: Abigail Miyamoto M.D.   On: 06/26/2017 15:36  I personally reviewed the CT and PET/CT images and concur with the findings noted above  Impression: Rachael Villanueva is a 67 year old woman with a history of tobacco abuse who was incidentally noted to have a 5.8 mm nodule on a CT scan about a year ago that was done to rule out pulmonary embolus.  On follow-up 10 months later the nodule had increased in size and density.  On PET CT the nodule had low-grade hypermetabolic activity which is concerning for a nodule of the size.  Given her age, smoking history, the appearance and progression of the nodule, and the PET appearance, this is in all likelihood a new primary bronchogenic carcinoma.  It has to be considered such unless it could be proven otherwise.  I had a very long discussion with Rachael Villanueva and her husband.  We reviewed the films.  I discussed CT guided and bronchoscopic  biopsy and their limitations.  They understand that with a small peripheral nodule like this there is a very high incidence of false negatives with both of those approaches.  Given that, in my opinion we could not trust those results.  The only definitive biopsy would be an excisional biopsy or wedge resection.  Given the high index of suspicion I think that is indicated in her case.  I recommended to them that we proceed with a left VATS, wedge resection, possible segmentectomy or lobectomy depending on intraoperative findings.  We discussed the general nature of the procedure, the need for general anesthesia, the incisions to be used, the expected hospital  stay, and the overall recovery.  I informed him of the indications, risks, benefits, alternatives, and intraoperative decision making.  They understand the risks include, but are not limited to death, MI, DVT, PE, bleeding, possible need for transfusion, infection, prolonged air leak, irregular heart rhythms, as well as other unforeseeable complications.  She was a bit hyperactive during this discussion and we change the subject frequently.  We went over this multiple times and I think she has a clear understanding.  She does need pulmonary function testing with and without bronchodilators.  I encouraged her to seek a second opinion if she so desired.  Plan: Left VATS for wedge resection possible segmentectomy or lobectomy on Monday, 08/13/2017  I spent 60 minutes with Rachael Villanueva during this visit  Melrose Nakayama, MD [facility] [phone]

## 2017-07-23 NOTE — H&P (View-Only) (Signed)
PCP is Glenford Bayley, DO Referring Provider is Glenford Bayley, DO  Chief Complaint  Patient presents with  . Lung Lesion    Surgical eval, Chest CT 05/22/17, PET Scan 06/26/17    HPI: Rachael Villanueva sent for consultation regarding a left upper lobe lung nodule.  Rachael Villanueva is a 67 year old woman with a past medical history significant for hypothyroidism status post ablation for Graves' disease, anxiety, hyperparathyroidism treated with parathyroidectomy, tobacco abuse, and emphysema.  She is vague about her smoking history but has smoked at least half pack of cigarettes for 50 years but at times has smoked more than that.  In December 2017 she fractured her knee.  In January 2018 she was having shoulder pain.  A CT angiogram of the chest was done to rule out pulmonary embolus.  That showed a 5.8 mm left upper lobe groundglass opacity.  She recently saw Dr. Marin Comment.  A repeat CT was done to follow-up the nodule.  The nodule had increased in size to 8 mm and was more solid in appearance.  A PET/CT showed the nodule was hypermetabolic with an SUV of 2.0.   She has been feeling well lately.  She denies loss of appetite but her husband thinks that she is eating less.  She is lost about 7 pounds over the past 3 months.  She denies chest pain, pressure, tightness, or shortness of breath with activity.  She denies any unusual cough or wheezing.  She has not had any unusual headaches or visual changes.  Zubrod Score: At the time of surgery this patient's most appropriate activity status/level should be described as: [x]     0    Normal activity, no symptoms []     1    Restricted in physical strenuous activity but ambulatory, able to do out light work []     2    Ambulatory and capable of self care, unable to do work activities, up and about >50 % of waking hours                              []     3    Only limited self care, in bed greater than 50% of waking hours []     4    Completely disabled, no self care,  confined to bed or chair []     5    Moribund  Past Medical History:  Diagnosis Date  . Anxiety   . Graves' disease   . Hypercalcemia   . Hypothyroidism    s/p post ablation for Graves Disease  . Lung nodule   . Pulmonary emphysema ([place])   . Sleep disorder   . Thyroid disease   . Vitamin D deficiency     Past Surgical History:  Procedure Laterality Date  . ABLATION     for graves dX  . EYE SURGERY    . PARATHYROIDECTOMY      Family History  Problem Relation Age of Onset  . Thyroid disease Mother   . Lung cancer Mother   . Parkinson's disease Father   . Thyroid disease Brother     Social History Social History   Tobacco Use  . Smoking status: Light Tobacco Smoker    Types: Cigarettes  . Smokeless tobacco: Never Used  Substance Use Topics  . Alcohol use: No    Alcohol/week: 0.0 oz    Comment: Quit 1989  . Drug use: No  Current Outpatient Medications  Medication Sig Dispense Refill  . ALPRAZolam (XANAX) 0.5 MG tablet Take 0.5 mg by mouth at bedtime as needed for anxiety.    . Armodafinil 250 MG tablet Take 250 mg by mouth daily.    . chlorhexidine (PERIDEX) 0.12 % solution Use as directed 10 mLs in the mouth or throat.   98  . ergocalciferol (VITAMIN D2) 50000 units capsule Take 50,000 Units by mouth once a week.    . levothyroxine (SYNTHROID, LEVOTHROID) 125 MCG tablet 112 mcg.   0  . traZODone (DESYREL) 50 MG tablet Take 50 mg by mouth at bedtime as needed for sleep.   0  . vortioxetine HBr (TRINTELLIX) 20 MG TABS Take 20 mg by mouth once.     No current facility-administered medications for this visit.     No Known Allergies  Review of Systems  Constitutional: Positive for appetite change and unexpected weight change. Negative for chills and fever.       Sleep disorder  HENT: Negative for trouble swallowing and voice change.   Respiratory: Negative for cough, chest tightness, shortness of breath and wheezing.   Cardiovascular: Negative for chest  pain, palpitations and leg swelling.  Gastrointestinal: Positive for constipation. Negative for abdominal pain.  Genitourinary: Negative for difficulty urinating and dysuria.  Musculoskeletal: Negative for arthralgias and back pain.  Neurological: Negative for seizures and syncope.  Hematological: Negative for adenopathy. Does not bruise/bleed easily.  Psychiatric/Behavioral: The patient is nervous/anxious.   All other systems reviewed and are negative.   BP (!) 150/83   Pulse 90   Resp 20   Ht 4' 11.5" (1.511 m)   Wt 113 lb (51.3 kg)   SpO2 98% Comment: RA  BMI 22.44 kg/m  Physical Exam  Constitutional: She is oriented to person, place, and time. She appears well-developed and well-nourished. No distress.  HENT:  Head: Normocephalic and atraumatic.  Mouth/Throat: No oropharyngeal exudate.  Eyes: Conjunctivae and EOM are normal. No scleral icterus.  Neck: Neck supple. No thyromegaly present.  Cardiovascular: Normal rate, regular rhythm, normal heart sounds and intact distal pulses. Exam reveals no gallop and no friction rub.  No murmur heard. Pulmonary/Chest: Effort normal and breath sounds normal. No respiratory distress. She has no wheezes. She has no rales.  Abdominal: Soft. She exhibits no distension.  Musculoskeletal: She exhibits no edema.  Lymphadenopathy:    She has no cervical adenopathy.  Neurological: She is alert and oriented to person, place, and time. No cranial nerve deficit. She exhibits normal muscle tone. Coordination normal.  Skin: Skin is warm and dry.  Psychiatric:  Extremely anxious  Vitals reviewed.    Diagnostic Tests: CT CHEST WITHOUT CONTRAST  TECHNIQUE: Multidetector CT imaging of the chest was performed following the standard protocol without IV contrast.  COMPARISON:  08/03/2016  FINDINGS: Cardiovascular: Limited evaluation without intravenous contrast. Atherosclerotic calcifications. No aneurysmal dilatation. Normal heart size. No  large pericardial effusion  Mediastinum/Nodes: Midline trachea. Diminutive or surgically absent thyroid. Esophagus within normal limits. Stable prominent left precarinal node measuring 11 mm  Lungs/Pleura: Minimal emphysema. No consolidation or effusion. Re- demonstrated left upper lobe pulmonary nodule, now appears more solid and measures 8 x 9 mm, average diameter of 9 mm. This is seen on series 5, image number 67. Nodule is spiculated.  Upper Abdomen: No acute abnormality.  Musculoskeletal: Stable osseous structures with mild superior endplate deformity at O96 and C7. Ankylosis T4 through T6.  IMPRESSION: 1. Interval increase in size of left  upper lobe pulmonary nodule, now measuring 9 mm in diameter, also appears more solid and spiculated. Given interval change in appearance and growth, findings would be suspicious for small lung carcinoma. Could consider correlation with PET-CT or tissue sampling. 2. Minimal emphysema These results will be called to the ordering clinician or representative by the Radiologist Assistant, and communication documented in the PACS or zVision Dashboard.  Aortic Atherosclerosis (ICD10-I70.0) and Emphysema (ICD10-J43.9).  Electronically Signed: By: Donavan Foil M.D. On: 05/22/2017 21:18 NUCLEAR MEDICINE PET SKULL BASE TO THIGH  TECHNIQUE: 6.5 mCi F-18 FDG was injected intravenously. Full-ring PET imaging was performed from the skull base to thigh after the radiotracer. CT data was obtained and used for attenuation correction and anatomic localization.  FASTING BLOOD GLUCOSE:  Value: 92 mg/dl  COMPARISON:  05/22/2017 and 08/03/2016  FINDINGS: NECK: No areas of abnormal hypermetabolism. No cervical adenopathy. Mucosal thickening of right maxillary sinus and sphenoid sinus.  CHEST: Mild hypermetabolism corresponding to a spiculated left upper lobe pulmonary nodule. This measures 8 x 8 mm and a S.U.V. max of 2.0 on image  29/series 8.  No thoracic nodal hypermetabolism. LAD versus left main coronary artery atherosclerosis on image 71/series 4.  ABDOMEN/PELVIS: No abdominopelvic parenchymal or nodal hypermetabolism. Normal adrenal glands. Abdominal aortic atherosclerosis. Colonic stool burden suggests constipation.  SKELETON: No suspicious osseous hypermetabolism. There is low-level hypermetabolism about the left shoulder which is likely degenerative. Note is made of L4-5 grade 1-2 anterolisthesis. Mild osteopenia.  IMPRESSION: 1. Low-level hypermetabolism, corresponding to the similar-sized spiculated left upper lobe pulmonary nodule. This level of hypermetabolism, given small nodule size, is suspicious for primary bronchogenic carcinoma. Consider tissue sampling. If sampling is not performed, recommend CT followup at 3 months. 2. No evidence of hypermetabolic thoracic nodal or extrathoracic disease. 3. Coronary artery atherosclerosis. Aortic Atherosclerosis (ICD10-I70.0). 4. Sinus disease.   Electronically Signed   By: Abigail Miyamoto M.D.   On: 06/26/2017 15:36  I personally reviewed the CT and PET/CT images and concur with the findings noted above  Impression: Rachael Villanueva is a 67 year old woman with a history of tobacco abuse who was incidentally noted to have a 5.8 mm nodule on a CT scan about a year ago that was done to rule out pulmonary embolus.  On follow-up 10 months later the nodule had increased in size and density.  On PET CT the nodule had low-grade hypermetabolic activity which is concerning for a nodule of the size.  Given her age, smoking history, the appearance and progression of the nodule, and the PET appearance, this is in all likelihood a new primary bronchogenic carcinoma.  It has to be considered such unless it could be proven otherwise.  I had a very long discussion with Rachael Villanueva and her husband.  We reviewed the films.  I discussed CT guided and bronchoscopic  biopsy and their limitations.  They understand that with a small peripheral nodule like this there is a very high incidence of false negatives with both of those approaches.  Given that, in my opinion we could not trust those results.  The only definitive biopsy would be an excisional biopsy or wedge resection.  Given the high index of suspicion I think that is indicated in her case.  I recommended to them that we proceed with a left VATS, wedge resection, possible segmentectomy or lobectomy depending on intraoperative findings.  We discussed the general nature of the procedure, the need for general anesthesia, the incisions to be used, the expected hospital  stay, and the overall recovery.  I informed him of the indications, risks, benefits, alternatives, and intraoperative decision making.  They understand the risks include, but are not limited to death, MI, DVT, PE, bleeding, possible need for transfusion, infection, prolonged air leak, irregular heart rhythms, as well as other unforeseeable complications.  She was a bit hyperactive during this discussion and we change the subject frequently.  We went over this multiple times and I think she has a clear understanding.  She does need pulmonary function testing with and without bronchodilators.  I encouraged her to seek a second opinion if she so desired.  Plan: Left VATS for wedge resection possible segmentectomy or lobectomy on Monday, 08/13/2017  I spent 60 minutes with Rachael Villanueva during this visit  Melrose Nakayama, MD [facility] [phone]

## 2017-07-24 ENCOUNTER — Other Ambulatory Visit: Payer: Self-pay | Admitting: *Deleted

## 2017-07-24 DIAGNOSIS — R911 Solitary pulmonary nodule: Secondary | ICD-10-CM

## 2017-08-08 NOTE — Pre-Procedure Instructions (Signed)
Rachael Villanueva  08/08/2017      [facility] [address] [place] [address] Phone: [phone] Fax: [phone]    Your procedure is scheduled on Monday February 4.  Report to [facility] Admitting at 5:30 A.M.  Call this number if you have problems the morning of surgery:  [phone]   Remember:  Do not eat food or drink liquids after midnight.  Take these medicines the morning of surgery with A SIP OF WATER: levothyroxine (synthroid), trintellix (if scheduled to take that day)  7 days prior to surgery STOP taking any Aspirin(unless otherwise instructed by your surgeon), Aleve, Naproxen, Ibuprofen, Motrin, Advil, Goody's, BC's, all herbal medications, fish oil, and all vitamins    Do not wear jewelry, make-up or nail polish.  Do not wear lotions, powders, or perfumes, or deodorant.  Do not shave 48 hours prior to surgery.  Men may shave face and neck.  Do not bring valuables to the hospital.  [facility] is not responsible for any belongings or valuables.  Contacts, dentures or bridgework may not be worn into surgery.  Leave your suitcase in the car.  After surgery it may be brought to your room.  For patients admitted to the hospital, discharge time will be determined by your treatment team.  Patients discharged the day of surgery will not be allowed to drive home.   Special instructions:    [facility]- Preparing For Surgery  Before surgery, you can play an important role. Because skin is not sterile, your skin needs to be as free of germs as possible. You can reduce the number of germs on your skin by washing with CHG (chlorahexidine gluconate) Soap before surgery.  CHG is an antiseptic cleaner which kills germs and bonds with the skin to continue killing germs even after washing.  Please do not use if you have an allergy to CHG or antibacterial soaps. If your skin becomes  reddened/irritated stop using the CHG.  Do not shave (including legs and underarms) for at least 48 hours prior to first CHG shower. It is OK to shave your face.  Please follow these instructions carefully.   1. Shower the NIGHT BEFORE SURGERY and the MORNING OF SURGERY with CHG.   2. If you chose to wash your hair, wash your hair first as usual with your normal shampoo.  3. After you shampoo, rinse your hair and body thoroughly to remove the shampoo.  4. Use CHG as you would any other liquid soap. You can apply CHG directly to the skin and wash gently with a scrungie or a clean washcloth.   5. Apply the CHG Soap to your body ONLY FROM THE NECK DOWN.  Do not use on open wounds or open sores. Avoid contact with your eyes, ears, mouth and genitals (private parts). Wash Face and genitals (private parts)  with your normal soap.  6. Wash thoroughly, paying special attention to the area where your surgery will be performed.  7. Thoroughly rinse your body with warm water from the neck down.  8. DO NOT shower/wash with your normal soap after using and rinsing off the CHG Soap.  9. Pat yourself dry with a CLEAN TOWEL.  10. Wear CLEAN PAJAMAS to bed the night before surgery, wear comfortable clothes the morning of surgery  11. Place CLEAN SHEETS on your bed the night of your first shower and DO NOT SLEEP WITH PETS.  Day of Surgery: Do not apply any deodorants/lotions. Please wear clean clothes to the hospital/surgery center.      Please read over the following fact sheets that you were given. Coughing and Deep Breathing, MRSA Information and Surgical Site Infection Prevention

## 2017-08-09 ENCOUNTER — Encounter (HOSPITAL_COMMUNITY): Payer: Self-pay | Admitting: *Deleted

## 2017-08-09 ENCOUNTER — Ambulatory Visit (HOSPITAL_COMMUNITY)
Admission: RE | Admit: 2017-08-09 | Discharge: 2017-08-09 | Disposition: A | Discharge status: Home / Self Care | Payer: Medicare HMO | Source: Ambulatory Visit | Attending: Thoracic Surgery (Cardiothoracic Vascular Surgery) | Admitting: Thoracic Surgery (Cardiothoracic Vascular Surgery)

## 2017-08-09 ENCOUNTER — Encounter (HOSPITAL_COMMUNITY)
Admission: RE | Admit: 2017-08-09 | Discharge: 2017-08-09 | Disposition: A | Discharge status: Home / Self Care | Payer: Medicare HMO | Source: Ambulatory Visit | Attending: Thoracic Surgery (Cardiothoracic Vascular Surgery) | Admitting: Thoracic Surgery (Cardiothoracic Vascular Surgery)

## 2017-08-09 ENCOUNTER — Other Ambulatory Visit: Payer: Self-pay

## 2017-08-09 DIAGNOSIS — Z0183 Encounter for blood typing: Secondary | ICD-10-CM | POA: Insufficient documentation

## 2017-08-09 DIAGNOSIS — Z01818 Encounter for other preprocedural examination: Secondary | ICD-10-CM | POA: Insufficient documentation

## 2017-08-09 DIAGNOSIS — R911 Solitary pulmonary nodule: Secondary | ICD-10-CM

## 2017-08-09 DIAGNOSIS — Z01812 Encounter for preprocedural laboratory examination: Secondary | ICD-10-CM | POA: Insufficient documentation

## 2017-08-09 LAB — PULMONARY FUNCTION TEST
DL/VA % PRED: 72 %
DL/VA: 3.09 ml/min/mmHg/L
DLCO UNC: 13.63 ml/min/mmHg
DLCO unc % pred: 72 %
FEF 25-75 POST: 1.16 L/s
FEF 25-75 Pre: 0.95 L/sec
FEF2575-%CHANGE-POST: 22 %
FEF2575-%PRED-POST: 65 %
FEF2575-%Pred-Pre: 53 %
FEV1-%CHANGE-POST: 4 %
FEV1-%PRED-PRE: 85 %
FEV1-%Pred-Post: 90 %
FEV1-Post: 1.76 L
FEV1-Pre: 1.68 L
FEV1FVC-%CHANGE-POST: 1 %
FEV1FVC-%PRED-PRE: 86 %
FEV6-%Change-Post: 4 %
FEV6-%Pred-Post: 106 %
FEV6-%Pred-Pre: 102 %
FEV6-PRE: 2.52 L
FEV6-Post: 2.63 L
FEV6FVC-%Change-Post: 0 %
FEV6FVC-%Pred-Post: 104 %
FEV6FVC-%Pred-Pre: 103 %
FVC-%CHANGE-POST: 3 %
FVC-%PRED-POST: 102 %
FVC-%PRED-PRE: 98 %
FVC-POST: 2.66 L
FVC-PRE: 2.56 L
POST FEV1/FVC RATIO: 66 %
Post FEV6/FVC ratio: 99 %
Pre FEV1/FVC ratio: 66 %
Pre FEV6/FVC Ratio: 98 %
RV % pred: 151 %
RV: 2.96 L
TLC % pred: 126 %
TLC: 5.63 L

## 2017-08-09 LAB — BLOOD GAS, ARTERIAL
Acid-Base Excess: 0.1 mmol/L (ref 0.0–2.0)
BICARBONATE: 23.1 mmol/L (ref 20.0–28.0)
Drawn by: 421801
O2 Saturation: 94.6 %
PCO2 ART: 29.9 mmHg — AB (ref 32.0–48.0)
PH ART: 7.498 — AB (ref 7.350–7.450)
Patient temperature: 98.6
pO2, Arterial: 84.4 mmHg (ref 83.0–108.0)

## 2017-08-09 LAB — TYPE AND SCREEN
ABO/RH(D): A POS
ANTIBODY SCREEN: NEGATIVE

## 2017-08-09 LAB — COMPREHENSIVE METABOLIC PANEL
ALT: 21 U/L (ref 14–54)
ANION GAP: 13 (ref 5–15)
AST: 23 U/L (ref 15–41)
Albumin: 4.1 g/dL (ref 3.5–5.0)
Alkaline Phosphatase: 94 U/L (ref 38–126)
BUN: 19 mg/dL (ref 6–20)
CO2: 19 mmol/L — ABNORMAL LOW (ref 22–32)
Calcium: 9.4 mg/dL (ref 8.9–10.3)
Chloride: 104 mmol/L (ref 101–111)
Creatinine, Ser: 0.65 mg/dL (ref 0.44–1.00)
GFR calc Af Amer: >60 mL/min (ref >60)
Glucose, Bld: 111 mg/dL — ABNORMAL HIGH (ref 65–99)
POTASSIUM: 3.9 mmol/L (ref 3.5–5.1)
Sodium: 136 mmol/L (ref 135–145)
Total Bilirubin: 0.4 mg/dL (ref 0.3–1.2)
Total Protein: 6.7 g/dL (ref 6.5–8.1)

## 2017-08-09 LAB — URINALYSIS, ROUTINE W REFLEX MICROSCOPIC
BACTERIA UA: NONE SEEN
BILIRUBIN URINE: NEGATIVE
Glucose, UA: NEGATIVE mg/dL
Ketones, ur: 5 mg/dL — AB
Leukocytes, UA: NEGATIVE
NITRITE: NEGATIVE
PROTEIN: NEGATIVE mg/dL
Specific Gravity, Urine: 1.023 (ref 1.005–1.030)
pH: 5 (ref 5.0–8.0)

## 2017-08-09 LAB — ABO/RH: ABO/RH(D): A POS

## 2017-08-09 LAB — CBC
HEMATOCRIT: 40.9 % (ref 36.0–46.0)
Hemoglobin: 13.5 g/dL (ref 12.0–15.0)
MCH: 30 pg (ref 26.0–34.0)
MCHC: 33 g/dL (ref 30.0–36.0)
MCV: 90.9 fL (ref 78.0–100.0)
Platelets: 276 10*3/uL (ref 150–400)
RBC: 4.5 MIL/uL (ref 3.87–5.11)
RDW: 13.8 % (ref 11.5–15.5)
WBC: 8 10*3/uL (ref 4.0–10.5)

## 2017-08-09 LAB — APTT: aPTT: 25 seconds (ref 24–36)

## 2017-08-09 LAB — PROTIME-INR
INR: 0.88
PROTHROMBIN TIME: 11.9 s (ref 11.4–15.2)

## 2017-08-09 LAB — SURGICAL PCR SCREEN
MRSA, PCR: NEGATIVE
Staphylococcus aureus: NEGATIVE

## 2017-08-09 MED ORDER — ALBUTEROL SULFATE (2.5 MG/3ML) 0.083% IN NEBU
2.5000 mg | INHALATION_SOLUTION | Freq: Once | RESPIRATORY_TRACT | Status: AC
  Administered 2017-08-09: 2.5 mg via RESPIRATORY_TRACT

## 2017-08-09 NOTE — Progress Notes (Signed)
PCP is Dr. Rikki Spearing Denies any chest pain, fever, or cough. States she may have seen a cardiologist many years ago, but doesn't remember the drs name, Denies ever having a card cath. States she may have had a stress test and echo, but it was more than 10 years ago if she did. Cxr will need to be done on day of surgery.

## 2017-08-13 ENCOUNTER — Inpatient Hospital Stay (HOSPITAL_COMMUNITY): Payer: Medicare HMO | Admitting: Certified Registered Nurse Anesthetist

## 2017-08-13 ENCOUNTER — Inpatient Hospital Stay (HOSPITAL_COMMUNITY): Payer: Medicare HMO

## 2017-08-13 ENCOUNTER — Encounter (HOSPITAL_COMMUNITY)
Admission: RE | Disposition: A | Discharge status: Home / Self Care | Payer: Self-pay | Source: Ambulatory Visit | Attending: Thoracic Surgery (Cardiothoracic Vascular Surgery)

## 2017-08-13 ENCOUNTER — Inpatient Hospital Stay (HOSPITAL_COMMUNITY)
Admission: RE | Admit: 2017-08-13 | Discharge: 2017-08-19 | DRG: 164 | Disposition: A | Discharge status: Home / Self Care | Payer: Medicare HMO | Source: Ambulatory Visit | Attending: Thoracic Surgery (Cardiothoracic Vascular Surgery) | Admitting: Thoracic Surgery (Cardiothoracic Vascular Surgery)

## 2017-08-13 ENCOUNTER — Encounter (HOSPITAL_COMMUNITY): Payer: Self-pay | Admitting: Certified Registered Nurse Anesthetist

## 2017-08-13 DIAGNOSIS — J939 Pneumothorax, unspecified: Secondary | ICD-10-CM

## 2017-08-13 DIAGNOSIS — Y838 Other surgical procedures as the cause of abnormal reaction of the patient, or of later complication, without mention of misadventure at the time of the procedure: Secondary | ICD-10-CM | POA: Diagnosis not present

## 2017-08-13 DIAGNOSIS — Z820 Family history of epilepsy and other diseases of the nervous system: Secondary | ICD-10-CM | POA: Diagnosis not present

## 2017-08-13 DIAGNOSIS — J309 Allergic rhinitis, unspecified: Secondary | ICD-10-CM | POA: Diagnosis not present

## 2017-08-13 DIAGNOSIS — J95812 Postprocedural air leak: Secondary | ICD-10-CM | POA: Diagnosis not present

## 2017-08-13 DIAGNOSIS — C3412 Malignant neoplasm of upper lobe, left bronchus or lung: Secondary | ICD-10-CM | POA: Diagnosis not present

## 2017-08-13 DIAGNOSIS — R911 Solitary pulmonary nodule: Secondary | ICD-10-CM | POA: Diagnosis not present

## 2017-08-13 DIAGNOSIS — F419 Anxiety disorder, unspecified: Secondary | ICD-10-CM | POA: Diagnosis present

## 2017-08-13 DIAGNOSIS — I4891 Unspecified atrial fibrillation: Secondary | ICD-10-CM | POA: Diagnosis not present

## 2017-08-13 DIAGNOSIS — Z8349 Family history of other endocrine, nutritional and metabolic diseases: Secondary | ICD-10-CM

## 2017-08-13 DIAGNOSIS — G8918 Other acute postprocedural pain: Secondary | ICD-10-CM

## 2017-08-13 DIAGNOSIS — Z79890 Hormone replacement therapy (postmenopausal): Secondary | ICD-10-CM

## 2017-08-13 DIAGNOSIS — I4892 Unspecified atrial flutter: Secondary | ICD-10-CM | POA: Diagnosis not present

## 2017-08-13 DIAGNOSIS — Z801 Family history of malignant neoplasm of trachea, bronchus and lung: Secondary | ICD-10-CM | POA: Diagnosis not present

## 2017-08-13 DIAGNOSIS — Z01818 Encounter for other preprocedural examination: Secondary | ICD-10-CM

## 2017-08-13 DIAGNOSIS — Z902 Acquired absence of lung [part of]: Secondary | ICD-10-CM

## 2017-08-13 DIAGNOSIS — C3492 Malignant neoplasm of unspecified part of left bronchus or lung: Secondary | ICD-10-CM

## 2017-08-13 DIAGNOSIS — G4700 Insomnia, unspecified: Secondary | ICD-10-CM | POA: Diagnosis present

## 2017-08-13 DIAGNOSIS — E559 Vitamin D deficiency, unspecified: Secondary | ICD-10-CM | POA: Diagnosis present

## 2017-08-13 DIAGNOSIS — Z79899 Other long term (current) drug therapy: Secondary | ICD-10-CM | POA: Diagnosis not present

## 2017-08-13 DIAGNOSIS — C771 Secondary and unspecified malignant neoplasm of intrathoracic lymph nodes: Secondary | ICD-10-CM | POA: Diagnosis not present

## 2017-08-13 DIAGNOSIS — J439 Emphysema, unspecified: Secondary | ICD-10-CM | POA: Diagnosis not present

## 2017-08-13 DIAGNOSIS — F329 Major depressive disorder, single episode, unspecified: Secondary | ICD-10-CM | POA: Diagnosis present

## 2017-08-13 DIAGNOSIS — K5900 Constipation, unspecified: Secondary | ICD-10-CM | POA: Diagnosis present

## 2017-08-13 DIAGNOSIS — E039 Hypothyroidism, unspecified: Secondary | ICD-10-CM | POA: Diagnosis not present

## 2017-08-13 DIAGNOSIS — G479 Sleep disorder, unspecified: Secondary | ICD-10-CM | POA: Diagnosis present

## 2017-08-13 DIAGNOSIS — M25512 Pain in left shoulder: Secondary | ICD-10-CM

## 2017-08-13 DIAGNOSIS — I700 Atherosclerosis of aorta: Secondary | ICD-10-CM | POA: Diagnosis not present

## 2017-08-13 DIAGNOSIS — T8182XA Emphysema (subcutaneous) resulting from a procedure, initial encounter: Secondary | ICD-10-CM | POA: Diagnosis not present

## 2017-08-13 DIAGNOSIS — J60 Coalworker's pneumoconiosis: Secondary | ICD-10-CM | POA: Diagnosis not present

## 2017-08-13 DIAGNOSIS — Z419 Encounter for procedure for purposes other than remedying health state, unspecified: Secondary | ICD-10-CM

## 2017-08-13 DIAGNOSIS — F17210 Nicotine dependence, cigarettes, uncomplicated: Secondary | ICD-10-CM | POA: Diagnosis present

## 2017-08-13 DIAGNOSIS — R69 Illness, unspecified: Secondary | ICD-10-CM | POA: Diagnosis not present

## 2017-08-13 DIAGNOSIS — Z4682 Encounter for fitting and adjustment of non-vascular catheter: Secondary | ICD-10-CM | POA: Diagnosis not present

## 2017-08-13 DIAGNOSIS — R110 Nausea: Secondary | ICD-10-CM | POA: Diagnosis not present

## 2017-08-13 HISTORY — PX: VIDEO ASSISTED THORACOSCOPY (VATS)/ LOBECTOMY: SHX6169

## 2017-08-13 HISTORY — DX: Depression, unspecified: F32.A

## 2017-08-13 HISTORY — DX: Major depressive disorder, single episode, unspecified: F32.9

## 2017-08-13 LAB — GLUCOSE, CAPILLARY
GLUCOSE-CAPILLARY: 134 mg/dL — AB (ref 65–99)
Glucose-Capillary: 122 mg/dL — ABNORMAL HIGH (ref 65–99)
Glucose-Capillary: 176 mg/dL — ABNORMAL HIGH (ref 65–99)

## 2017-08-13 SURGERY — VIDEO ASSISTED THORACOSCOPY (VATS)/ LOBECTOMY
Anesthesia: General | Site: Chest | Laterality: Left

## 2017-08-13 MED ORDER — BUPIVACAINE 0.5 % ON-Q PUMP SINGLE CATH 400 ML
INJECTION | Status: AC | PRN
  Administered 2017-08-13: 400 mL

## 2017-08-13 MED ORDER — HYDROMORPHONE HCL 1 MG/ML IJ SOLN
INTRAMUSCULAR | Status: AC
  Filled 2017-08-13: qty 1

## 2017-08-13 MED ORDER — DEXAMETHASONE SODIUM PHOSPHATE 10 MG/ML IJ SOLN
INTRAMUSCULAR | Status: DC | PRN
  Administered 2017-08-13: 10 mg via INTRAVENOUS

## 2017-08-13 MED ORDER — ROCURONIUM BROMIDE 10 MG/ML (PF) SYRINGE
PREFILLED_SYRINGE | INTRAVENOUS | Status: AC
  Filled 2017-08-13: qty 5

## 2017-08-13 MED ORDER — FENTANYL CITRATE (PF) 100 MCG/2ML IJ SOLN
INTRAMUSCULAR | Status: AC
  Filled 2017-08-13: qty 2

## 2017-08-13 MED ORDER — VORTIOXETINE HBR 20 MG PO TABS: 20.0000 mg | ORAL_TABLET | ORAL | Status: DC

## 2017-08-13 MED ORDER — BUPIVACAINE HCL (PF) 0.5 % IJ SOLN
INTRAMUSCULAR | Status: AC
  Filled 2017-08-13: qty 10

## 2017-08-13 MED ORDER — EPHEDRINE SULFATE-NACL 50-0.9 MG/10ML-% IV SOSY
PREFILLED_SYRINGE | INTRAVENOUS | Status: DC | PRN
  Administered 2017-08-13 (×2): 10 mg via INTRAVENOUS
  Administered 2017-08-13: 5 mg via INTRAVENOUS

## 2017-08-13 MED ORDER — ROCURONIUM BROMIDE 100 MG/10ML IV SOLN
INTRAVENOUS | Status: DC | PRN
  Administered 2017-08-13: 10 mg via INTRAVENOUS
  Administered 2017-08-13 (×3): 20 mg via INTRAVENOUS
  Administered 2017-08-13: 50 mg via INTRAVENOUS
  Administered 2017-08-13: 20 mg via INTRAVENOUS

## 2017-08-13 MED ORDER — FENTANYL CITRATE (PF) 100 MCG/2ML IJ SOLN
INTRAMUSCULAR | Status: DC | PRN
  Administered 2017-08-13 (×5): 50 ug via INTRAVENOUS
  Administered 2017-08-13: 100 ug via INTRAVENOUS
  Administered 2017-08-13 (×2): 50 ug via INTRAVENOUS

## 2017-08-13 MED ORDER — SENNOSIDES-DOCUSATE SODIUM 8.6-50 MG PO TABS
1.0000 | ORAL_TABLET | Freq: Every day | ORAL | Status: DC
Start: 2017-08-13 — End: 2017-08-19
  Administered 2017-08-13 – 2017-08-18 (×5): 1 via ORAL
  Filled 2017-08-13 (×6): qty 1

## 2017-08-13 MED ORDER — SUGAMMADEX SODIUM 200 MG/2ML IV SOLN
INTRAVENOUS | Status: AC
  Filled 2017-08-13: qty 2

## 2017-08-13 MED ORDER — FENTANYL CITRATE (PF) 250 MCG/5ML IJ SOLN
INTRAMUSCULAR | Status: AC
  Filled 2017-08-13: qty 5

## 2017-08-13 MED ORDER — HYDROMORPHONE HCL 1 MG/ML IJ SOLN
0.2500 mg | INTRAMUSCULAR | Status: DC | PRN
  Administered 2017-08-13: 1 mg via INTRAVENOUS

## 2017-08-13 MED ORDER — MIDAZOLAM HCL 5 MG/5ML IJ SOLN
INTRAMUSCULAR | Status: DC | PRN
  Administered 2017-08-13: 2 mg via INTRAVENOUS

## 2017-08-13 MED ORDER — ONDANSETRON HCL 4 MG/2ML IJ SOLN
4.0000 mg | Freq: Four times a day (QID) | INTRAMUSCULAR | Status: DC | PRN
  Administered 2017-08-18 – 2017-08-19 (×2): 4 mg via INTRAVENOUS
  Filled 2017-08-13 (×2): qty 2

## 2017-08-13 MED ORDER — NALOXONE HCL 0.4 MG/ML IJ SOLN: 0.4000 mg | INTRAMUSCULAR | Status: DC | PRN

## 2017-08-13 MED ORDER — INSULIN ASPART 100 UNIT/ML ~~LOC~~ SOLN
0.0000 [IU] | SUBCUTANEOUS | Status: DC
  Administered 2017-08-13: 4 [IU] via SUBCUTANEOUS
  Administered 2017-08-13 – 2017-08-14 (×2): 2 [IU] via SUBCUTANEOUS

## 2017-08-13 MED ORDER — VANCOMYCIN HCL IN DEXTROSE 1-5 GM/200ML-% IV SOLN
1000.0000 mg | Freq: Two times a day (BID) | INTRAVENOUS | Status: AC
  Administered 2017-08-13: 1000 mg via INTRAVENOUS
  Filled 2017-08-13: qty 200

## 2017-08-13 MED ORDER — ONDANSETRON HCL 4 MG/2ML IJ SOLN
4.0000 mg | Freq: Once | INTRAMUSCULAR | Status: AC | PRN
  Administered 2017-08-13: 4 mg via INTRAVENOUS

## 2017-08-13 MED ORDER — DIPHENHYDRAMINE HCL 50 MG/ML IJ SOLN: 12.5000 mg | Freq: Four times a day (QID) | INTRAMUSCULAR | Status: DC | PRN

## 2017-08-13 MED ORDER — ONDANSETRON HCL 4 MG/2ML IJ SOLN
INTRAMUSCULAR | Status: AC
  Filled 2017-08-13: qty 2

## 2017-08-13 MED ORDER — DEXTROSE-NACL 5-0.45 % IV SOLN
INTRAVENOUS | Status: DC
Start: 2017-08-13 — End: 2017-08-19
  Administered 2017-08-13 – 2017-08-16 (×3): via INTRAVENOUS

## 2017-08-13 MED ORDER — MIDAZOLAM HCL 2 MG/2ML IJ SOLN
INTRAMUSCULAR | Status: AC
  Filled 2017-08-13: qty 2

## 2017-08-13 MED ORDER — PROPOFOL 10 MG/ML IV BOLUS
INTRAVENOUS | Status: AC
  Filled 2017-08-13: qty 20

## 2017-08-13 MED ORDER — LEVOTHYROXINE SODIUM 112 MCG PO TABS
112.0000 ug | ORAL_TABLET | Freq: Every day | ORAL | Status: DC
  Administered 2017-08-14 – 2017-08-19 (×6): 112 ug via ORAL
  Filled 2017-08-13 (×6): qty 1

## 2017-08-13 MED ORDER — ACETAMINOPHEN 500 MG PO TABS
1000.0000 mg | ORAL_TABLET | Freq: Four times a day (QID) | ORAL | Status: AC
  Administered 2017-08-13 – 2017-08-18 (×15): 1000 mg via ORAL
  Filled 2017-08-13 (×16): qty 2

## 2017-08-13 MED ORDER — TRAZODONE HCL 50 MG PO TABS: 50.0000 mg | ORAL_TABLET | Freq: Every day | ORAL | Status: DC

## 2017-08-13 MED ORDER — PROPOFOL 10 MG/ML IV BOLUS
INTRAVENOUS | Status: DC | PRN
  Administered 2017-08-13: 120 mg via INTRAVENOUS

## 2017-08-13 MED ORDER — ACETAMINOPHEN 160 MG/5ML PO SOLN: 1000.0000 mg | Freq: Four times a day (QID) | ORAL | Status: AC

## 2017-08-13 MED ORDER — 0.9 % SODIUM CHLORIDE (POUR BTL) OPTIME
TOPICAL | Status: DC | PRN
  Administered 2017-08-13: 2000 mL

## 2017-08-13 MED ORDER — FENTANYL CITRATE (PF) 100 MCG/2ML IJ SOLN: 25.0000 ug | INTRAMUSCULAR | Status: DC | PRN

## 2017-08-13 MED ORDER — DEXTROSE 5 % IV SOLN
1.5000 g | Freq: Two times a day (BID) | INTRAVENOUS | Status: AC
  Administered 2017-08-14 (×2): 1.5 g via INTRAVENOUS
  Filled 2017-08-13 (×2): qty 1.5

## 2017-08-13 MED ORDER — SODIUM CHLORIDE 0.9% FLUSH: 9.0000 mL | INTRAVENOUS | Status: DC | PRN

## 2017-08-13 MED ORDER — BUPIVACAINE 0.5 % ON-Q PUMP SINGLE CATH 400 ML
400.0000 mL | INJECTION | Status: DC
  Filled 2017-08-13: qty 400

## 2017-08-13 MED ORDER — ALPRAZOLAM 0.25 MG PO TABS
0.2500 mg | ORAL_TABLET | Freq: Every evening | ORAL | Status: DC | PRN
  Filled 2017-08-13: qty 2

## 2017-08-13 MED ORDER — TRAMADOL HCL 50 MG PO TABS
50.0000 mg | ORAL_TABLET | Freq: Four times a day (QID) | ORAL | Status: DC | PRN
Start: 2017-08-13 — End: 2017-08-13

## 2017-08-13 MED ORDER — CHLORHEXIDINE GLUCONATE 0.12 % MT SOLN
15.0000 mL | Freq: Every day | OROMUCOSAL | Status: DC
  Administered 2017-08-14 – 2017-08-15 (×2): 15 mL via OROMUCOSAL
  Filled 2017-08-13 (×4): qty 15

## 2017-08-13 MED ORDER — FENTANYL 40 MCG/ML IV SOLN
INTRAVENOUS | Status: DC
  Administered 2017-08-13: 1000 ug via INTRAVENOUS
  Administered 2017-08-13: 75 ug via INTRAVENOUS
  Administered 2017-08-13: 2 ug via INTRAVENOUS
  Administered 2017-08-14: 45 ug via INTRAVENOUS
  Administered 2017-08-14: 60 ug via INTRAVENOUS
  Administered 2017-08-14: 12.13 ug via INTRAVENOUS
  Administered 2017-08-14: 10.5 ug via INTRAVENOUS
  Administered 2017-08-14: 8.75 ug via INTRAVENOUS
  Administered 2017-08-14: 150 ug via INTRAVENOUS
  Administered 2017-08-15: 75 ug via INTRAVENOUS
  Administered 2017-08-15: 1000 ug via INTRAVENOUS
  Administered 2017-08-15: 40 ug via INTRAVENOUS
  Administered 2017-08-15: 45 ug via INTRAVENOUS
  Administered 2017-08-15: 0 ug via INTRAVENOUS
  Administered 2017-08-15 – 2017-08-16 (×2): 60 ug via INTRAVENOUS
  Administered 2017-08-16: 40 ug via INTRAVENOUS
  Administered 2017-08-17: 30 ug via INTRAVENOUS
  Administered 2017-08-17: 60 ug via INTRAVENOUS
  Administered 2017-08-17: 15 ug via INTRAVENOUS
  Filled 2017-08-13 (×2): qty 25

## 2017-08-13 MED ORDER — DEXTROSE 5 % IV SOLN
1.5000 g | INTRAVENOUS | Status: AC
  Administered 2017-08-13: 1.5 g via INTRAVENOUS

## 2017-08-13 MED ORDER — ENOXAPARIN SODIUM 40 MG/0.4ML ~~LOC~~ SOLN
40.0000 mg | Freq: Every day | SUBCUTANEOUS | Status: DC
  Administered 2017-08-13 – 2017-08-18 (×6): 40 mg via SUBCUTANEOUS
  Filled 2017-08-13 (×6): qty 0.4

## 2017-08-13 MED ORDER — VORTIOXETINE HBR 20 MG PO TABS
20.0000 mg | ORAL_TABLET | ORAL | Status: DC
  Filled 2017-08-13: qty 20

## 2017-08-13 MED ORDER — DIPHENHYDRAMINE HCL 12.5 MG/5ML PO ELIX
12.5000 mg | ORAL_SOLUTION | Freq: Four times a day (QID) | ORAL | Status: DC | PRN
  Filled 2017-08-13: qty 5

## 2017-08-13 MED ORDER — LACTATED RINGERS IV SOLN
INTRAVENOUS | Status: DC
  Administered 2017-08-13 (×3): via INTRAVENOUS

## 2017-08-13 MED ORDER — TRAZODONE HCL 50 MG PO TABS
50.0000 mg | ORAL_TABLET | Freq: Every day | ORAL | Status: DC
  Administered 2017-08-13 – 2017-08-18 (×6): 50 mg via ORAL
  Filled 2017-08-13 (×6): qty 1

## 2017-08-13 MED ORDER — ONDANSETRON HCL 4 MG/2ML IJ SOLN
INTRAMUSCULAR | Status: DC | PRN
  Administered 2017-08-13: 4 mg via INTRAVENOUS

## 2017-08-13 MED ORDER — MEPERIDINE HCL 25 MG/ML IJ SOLN: 6.2500 mg | INTRAMUSCULAR | Status: DC | PRN

## 2017-08-13 MED ORDER — SUGAMMADEX SODIUM 200 MG/2ML IV SOLN
INTRAVENOUS | Status: DC | PRN
  Administered 2017-08-13: 100 mg via INTRAVENOUS

## 2017-08-13 MED ORDER — FENTANYL CITRATE (PF) 100 MCG/2ML IJ SOLN
INTRAMUSCULAR | Status: AC
  Administered 2017-08-13: 100 ug
  Filled 2017-08-13: qty 2

## 2017-08-13 MED ORDER — LIDOCAINE 2% (20 MG/ML) 5 ML SYRINGE
INTRAMUSCULAR | Status: AC
  Filled 2017-08-13: qty 5

## 2017-08-13 MED ORDER — MIDAZOLAM HCL 2 MG/2ML IJ SOLN
INTRAMUSCULAR | Status: AC
  Administered 2017-08-13: 4 mg via INTRAVENOUS
  Filled 2017-08-13: qty 2

## 2017-08-13 MED ORDER — POTASSIUM CHLORIDE 10 MEQ/50ML IV SOLN: 10.0000 meq | Freq: Every day | INTRAVENOUS | Status: DC | PRN

## 2017-08-13 MED ORDER — FENTANYL CITRATE (PF) 100 MCG/2ML IJ SOLN
100.0000 ug | Freq: Once | INTRAMUSCULAR | Status: AC
  Administered 2017-08-13: 100 ug via INTRAVENOUS
  Filled 2017-08-13: qty 2

## 2017-08-13 MED ORDER — BISACODYL 5 MG PO TBEC
10.0000 mg | DELAYED_RELEASE_TABLET | Freq: Every day | ORAL | Status: DC
  Administered 2017-08-13 – 2017-08-19 (×7): 10 mg via ORAL
  Filled 2017-08-13 (×7): qty 2

## 2017-08-13 MED ORDER — DEXAMETHASONE SODIUM PHOSPHATE 10 MG/ML IJ SOLN
INTRAMUSCULAR | Status: AC
  Filled 2017-08-13: qty 1

## 2017-08-13 MED ORDER — BUPIVACAINE ON-Q PAIN PUMP (FOR ORDER SET NO CHG)
INJECTION | Status: AC
Start: 2017-08-13 — End: 2017-08-16

## 2017-08-13 MED ORDER — OXYCODONE HCL 5 MG PO TABS
5.0000 mg | ORAL_TABLET | ORAL | Status: DC | PRN
  Administered 2017-08-13: 5 mg via ORAL
  Administered 2017-08-14 – 2017-08-19 (×11): 10 mg via ORAL
  Filled 2017-08-13 (×9): qty 2
  Filled 2017-08-13: qty 1
  Filled 2017-08-13 (×2): qty 2

## 2017-08-13 MED ORDER — BUPIVACAINE HCL (PF) 0.5 % IJ SOLN
INTRAMUSCULAR | Status: DC | PRN
  Administered 2017-08-13: 5 mL

## 2017-08-13 MED ORDER — ALPRAZOLAM 0.25 MG PO TABS
0.2500 mg | ORAL_TABLET | Freq: Every evening | ORAL | Status: DC | PRN
  Administered 2017-08-13 – 2017-08-14 (×2): 0.5 mg via ORAL
  Filled 2017-08-13: qty 2

## 2017-08-13 MED ORDER — MIDAZOLAM HCL 2 MG/2ML IJ SOLN
4.0000 mg | Freq: Once | INTRAMUSCULAR | Status: AC
  Administered 2017-08-13: 4 mg via INTRAVENOUS

## 2017-08-13 MED ORDER — DEXTROSE 5 % IV SOLN
INTRAVENOUS | Status: AC
  Filled 2017-08-13: qty 1.5

## 2017-08-13 SURGICAL SUPPLY — 96 items
ADH SKN CLS APL DERMABOND .7 (GAUZE/BANDAGES/DRESSINGS) ×4
APL SKNCLS STERI-STRIP NONHPOA (GAUZE/BANDAGES/DRESSINGS) ×2
BAG SPEC RTRVL LRG 6X4 10 (ENDOMECHANICALS) ×2
BENZOIN TINCTURE PRP APPL 2/3 (GAUZE/BANDAGES/DRESSINGS) ×3 IMPLANT
CANISTER SUCT 3000ML PPV (MISCELLANEOUS) ×6 IMPLANT
CATH KIT ON Q 5IN SLV (PAIN MANAGEMENT) IMPLANT
CATH KIT ON-Q SILVERSOAK 5 (CATHETERS) IMPLANT
CATH KIT ON-Q SILVERSOAK 5IN (CATHETERS) ×3 IMPLANT
CATH THORACIC 28FR (CATHETERS) ×2 IMPLANT
CATH THORACIC 36FR (CATHETERS) IMPLANT
CATH THORACIC 36FR RT ANG (CATHETERS) IMPLANT
CLIP VESOCCLUDE MED 6/CT (CLIP) ×3 IMPLANT
CONN ST 1/4X3/8  BEN (MISCELLANEOUS)
CONN ST 1/4X3/8 BEN (MISCELLANEOUS) IMPLANT
CONN Y 3/8X3/8X3/8  BEN (MISCELLANEOUS)
CONN Y 3/8X3/8X3/8 BEN (MISCELLANEOUS) IMPLANT
CONT SPEC 4OZ CLIKSEAL STRL BL (MISCELLANEOUS) ×20 IMPLANT
COVER SURGICAL LIGHT HANDLE (MISCELLANEOUS) ×3 IMPLANT
CUTTER ECHEON FLEX ENDO 45 340 (ENDOMECHANICALS) ×2 IMPLANT
DERMABOND ADVANCED (GAUZE/BANDAGES/DRESSINGS) ×2
DERMABOND ADVANCED .7 DNX12 (GAUZE/BANDAGES/DRESSINGS) ×3 IMPLANT
DRAIN CHANNEL 28F RND 3/8 FF (WOUND CARE) IMPLANT
DRAIN CHANNEL 32F RND 10.7 FF (WOUND CARE) IMPLANT
DRAPE LAPAROSCOPIC ABDOMINAL (DRAPES) ×3 IMPLANT
DRAPE WARM FLUID 44X44 (DRAPE) ×3 IMPLANT
ELECT BLADE 6.5 EXT (BLADE) ×3 IMPLANT
ELECT REM PT RETURN 9FT ADLT (ELECTROSURGICAL) ×3
ELECTRODE REM PT RTRN 9FT ADLT (ELECTROSURGICAL) ×2 IMPLANT
GAUZE SPONGE 4X4 12PLY STRL (GAUZE/BANDAGES/DRESSINGS) ×2 IMPLANT
GLOVE SURG SIGNA 7.5 PF LTX (GLOVE) ×6 IMPLANT
GOWN STRL REUS W/ TWL LRG LVL3 (GOWN DISPOSABLE) ×4 IMPLANT
GOWN STRL REUS W/ TWL XL LVL3 (GOWN DISPOSABLE) ×4 IMPLANT
GOWN STRL REUS W/TWL LRG LVL3 (GOWN DISPOSABLE) ×6
GOWN STRL REUS W/TWL XL LVL3 (GOWN DISPOSABLE) ×6
HEMOSTAT SURGICEL 2X14 (HEMOSTASIS) IMPLANT
KIT BASIN OR (CUSTOM PROCEDURE TRAY) ×3 IMPLANT
KIT ROOM TURNOVER OR (KITS) ×3 IMPLANT
KIT SUCTION CATH 14FR (SUCTIONS) ×3 IMPLANT
NS IRRIG 1000ML POUR BTL (IV SOLUTION) ×9 IMPLANT
PACK CHEST (CUSTOM PROCEDURE TRAY) ×3 IMPLANT
PAD ARMBOARD 7.5X6 YLW CONV (MISCELLANEOUS) ×6 IMPLANT
POUCH ENDO CATCH II 15MM (MISCELLANEOUS) IMPLANT
POUCH SPECIMEN RETRIEVAL 10MM (ENDOMECHANICALS) ×2 IMPLANT
PROGEL SPRAY TIP 11IN (MISCELLANEOUS) ×3
RELOAD STAPLE 35X2.5 WHT THIN (STAPLE) IMPLANT
RELOAD STAPLE 60 3.8 GOLD REG (STAPLE) IMPLANT
RELOAD STAPLE 60 4.1 GRN THCK (STAPLE) IMPLANT
RELOAD STAPLER GOLD 60MM (STAPLE) ×28 IMPLANT
RELOAD STAPLER GREEN 60MM (STAPLE) ×2 IMPLANT
SCISSORS ENDO CVD 5DCS (MISCELLANEOUS) IMPLANT
SEALANT PROGEL (MISCELLANEOUS) ×2 IMPLANT
SEALANT SURG COSEAL 4ML (VASCULAR PRODUCTS) IMPLANT
SEALANT SURG COSEAL 8ML (VASCULAR PRODUCTS) IMPLANT
SHEARS HARMONIC HDI 20CM (ELECTROSURGICAL) IMPLANT
SOLUTION ANTI FOG 6CC (MISCELLANEOUS) ×3 IMPLANT
SPECIMEN JAR MEDIUM (MISCELLANEOUS) ×3 IMPLANT
SPONGE INTESTINAL PEANUT (DISPOSABLE) ×8 IMPLANT
SPONGE TONSIL 1 RF SGL (DISPOSABLE) ×3 IMPLANT
STAPLE ECHEON FLEX 60 POW ENDO (STAPLE) ×2 IMPLANT
STAPLE RELOAD 2.5MM WHITE (STAPLE) ×9 IMPLANT
STAPLER ENDO GIA 12 SHRT THIN (STAPLE) IMPLANT
STAPLER ENDO GIA 12MM SHORT (STAPLE) IMPLANT
STAPLER RELOAD GOLD 60MM (STAPLE) ×42
STAPLER RELOAD GREEN 60MM (STAPLE) ×3
SUT PROLENE 4 0 RB 1 (SUTURE)
SUT PROLENE 4-0 RB1 .5 CRCL 36 (SUTURE) IMPLANT
SUT SILK  1 MH (SUTURE) ×2
SUT SILK 1 MH (SUTURE) ×4 IMPLANT
SUT SILK 1 TIES 10X30 (SUTURE) ×3 IMPLANT
SUT SILK 2 0 SH (SUTURE) IMPLANT
SUT SILK 2 0SH CR/8 30 (SUTURE) IMPLANT
SUT SILK 3 0 SH 30 (SUTURE) IMPLANT
SUT SILK 3 0SH CR/8 30 (SUTURE) ×3 IMPLANT
SUT VIC AB 0 CTX 27 (SUTURE) IMPLANT
SUT VIC AB 1 CTX 27 (SUTURE) ×3 IMPLANT
SUT VIC AB 2-0 CT1 27 (SUTURE)
SUT VIC AB 2-0 CT1 TAPERPNT 27 (SUTURE) IMPLANT
SUT VIC AB 2-0 CTX 36 (SUTURE) ×3 IMPLANT
SUT VIC AB 3-0 MH 27 (SUTURE) IMPLANT
SUT VIC AB 3-0 SH 27 (SUTURE)
SUT VIC AB 3-0 SH 27X BRD (SUTURE) IMPLANT
SUT VIC AB 3-0 X1 27 (SUTURE) ×5 IMPLANT
SUT VICRYL 0 UR6 27IN ABS (SUTURE) IMPLANT
SUT VICRYL 2 TP 1 (SUTURE) IMPLANT
SWAB CULTURE ESWAB REG 1ML (MISCELLANEOUS) IMPLANT
SYSTEM SAHARA CHEST DRAIN ATS (WOUND CARE) ×3 IMPLANT
TAPE CLOTH SURG 4X10 WHT LF (GAUZE/BANDAGES/DRESSINGS) ×2 IMPLANT
TIP APPLICATOR SPRAY EXTEND 16 (VASCULAR PRODUCTS) IMPLANT
TIP SPRAY PROGEL 11IN (MISCELLANEOUS) ×1 IMPLANT
TOWEL GREEN STERILE (TOWEL DISPOSABLE) ×3 IMPLANT
TOWEL GREEN STERILE FF (TOWEL DISPOSABLE) ×3 IMPLANT
TRAY FOLEY W/METER SILVER 16FR (SET/KITS/TRAYS/PACK) ×3 IMPLANT
TROCAR XCEL BLADELESS 5X75MML (TROCAR) ×3 IMPLANT
TROCAR XCEL NON-BLD 5MMX100MML (ENDOMECHANICALS) IMPLANT
TUNNELER SHEATH ON-Q 11GX8 DSP (PAIN MANAGEMENT) IMPLANT
WATER STERILE IRR 1000ML POUR (IV SOLUTION) ×6 IMPLANT

## 2017-08-13 NOTE — Anesthesia Postprocedure Evaluation (Signed)
Anesthesia Post Note  Patient: WALTER MIN  Procedure(s) Performed: VIDEO ASSISTED THORACOSCOPY (VATS)/ LOBECTOMY (Left Chest)     Patient location during evaluation: PACU Anesthesia Type: General Level of consciousness: awake and alert Pain management: pain level controlled Vital Signs Assessment: post-procedure vital signs reviewed and stable Respiratory status: spontaneous breathing, nonlabored ventilation, respiratory function stable and patient connected to nasal cannula oxygen Cardiovascular status: blood pressure returned to baseline and stable Postop Assessment: no apparent nausea or vomiting Anesthetic complications: no    Last Vitals:  Vitals:   08/13/17 1130 08/13/17 1530  BP: 110/71   Pulse: (!) 59   Resp: 14   Temp:  (!) 36.3 C  SpO2: 99%     Last Pain:  Vitals:   08/13/17 1022  TempSrc: Oral                 Carsynn Bethune DAVID

## 2017-08-13 NOTE — Anesthesia Preprocedure Evaluation (Addendum)
Anesthesia Evaluation  Patient identified by MRN, date of birth, ID band Patient awake    Reviewed: Allergy & Precautions, NPO status , Patient's Chart, lab work & pertinent test results  Airway Mallampati: I  TM Distance: >3 FB Neck ROM: Full    Dental  (+) Dental Advisory Given, Implants   Pulmonary Current Smoker,    Pulmonary exam normal        Cardiovascular Normal cardiovascular exam     Neuro/Psych Anxiety Depression    GI/Hepatic   Endo/Other    Renal/GU      Musculoskeletal   Abdominal   Peds  Hematology   Anesthesia Other Findings   Reproductive/Obstetrics                            Anesthesia Physical Anesthesia Plan  ASA: III  Anesthesia Plan: General   Post-op Pain Management:    Induction: Intravenous  PONV Risk Score and Plan: 2 and Ondansetron and Dexamethasone  Airway Management Planned: Double Lumen EBT  Additional Equipment: Arterial line, CVP and Ultrasound Guidance Line Placement  Intra-op Plan:   Post-operative Plan: Extubation in OR  Informed Consent: I have reviewed the patients History and Physical, chart, labs and discussed the procedure including the risks, benefits and alternatives for the proposed anesthesia with the patient or authorized representative who has indicated his/her understanding and acceptance.     Plan Discussed with: CRNA and Surgeon  Anesthesia Plan Comments:         Anesthesia Quick Evaluation

## 2017-08-13 NOTE — Anesthesia Procedure Notes (Signed)
Arterial Line Insertion Start/End2/10/2017 11:14 AM, 08/13/2017 11:24 AM Performed by: Imagene Riches, CRNA, CRNA  Patient location: Pre-op. Preanesthetic checklist: patient identified, IV checked, site marked, risks and benefits discussed, surgical consent, monitors and equipment checked, pre-op evaluation and anesthesia consent Patient sedated Right, radial was placed Catheter size: 20 G Hand hygiene performed  and maximum sterile barriers used   Attempts: 1 Procedure performed without using ultrasound guided technique. Following insertion, Biopatch and dressing applied. Post procedure assessment: normal  Post procedure complications: local hematoma. Patient tolerated the procedure well with no immediate complications.

## 2017-08-13 NOTE — Brief Op Note (Addendum)
08/13/2017  3:08 PM  PATIENT:  [name]  67 y.o. 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  PROCEDURE:   LEFT VATS WEDGE RESECTION LUL NODULE THORACOSCOPIC LINGULAR SEGMENTECTOMY MEDIASTINAL LYMPH NODE DISSECTION ON-Q LOCAL ANESTHETIC CATHETER PLACEMENT  SURGEON:  Surgeon(s) and Role:    * Melrose Nakayama, MD - Primary  PHYSICIAN ASSISTANT:  Nicholes Rough, PA-C  ANESTHESIA:   general  EBL:  50 mL   BLOOD ADMINISTERED:none  DRAINS: straight chest tube   LOCAL MEDICATIONS USED:  OTHER ON-Q  SPECIMEN:  Source of Specimen:  segment of the left upper lobe  DISPOSITION OF SPECIMEN:  PATHOLOGY  COUNTS:  YES  TOURNIQUET:  * No tourniquets in log *  DICTATION: .Dragon Dictation  PLAN OF CARE: Admit to inpatient   PATIENT DISPOSITION:  ICU - intubated and hemodynamically stable.   Delay start of Pharmacological VTE agent (>24hrs) due to surgical blood loss or risk of bleeding: no  FINDINGS- FROZEN= ADENOCARCINOMA

## 2017-08-13 NOTE — Anesthesia Procedure Notes (Signed)
Central Venous Catheter Insertion Performed by: Lillia Abed, MD, anesthesiologist Start/End2/10/2017 11:05 AM, 08/13/2017 11:15 AM Patient location: Pre-op. Preanesthetic checklist: patient identified, IV checked, risks and benefits discussed, surgical consent, monitors and equipment checked, pre-op evaluation, timeout performed and anesthesia consent Lidocaine 1% used for infiltration and patient sedated Hand hygiene performed  and maximum sterile barriers used  Catheter size: 8 Fr Total catheter length 16. Central line was placed.Double lumen Procedure performed using ultrasound guided technique. Ultrasound Notes:image(s) printed for medical record Attempts: 1 Following insertion, dressing applied, line sutured and Biopatch. Post procedure assessment: blood return through all ports, free fluid flow and no air  Patient tolerated the procedure well with no immediate complications.

## 2017-08-13 NOTE — Progress Notes (Signed)
Patient c/o itching after prepped with CHG wipes.  Patient given a wet washcloth to wipe down with.  Will continue to monitor patient.

## 2017-08-13 NOTE — Interval H&P Note (Signed)
History and Physical Interval Note:  08/13/2017 11:09 AM  [name]  has presented today for surgery, with the diagnosis of LUL NODULE  The various methods of treatment have been discussed with the patient and family. After consideration of risks, benefits and other options for treatment, the patient has consented to  Procedure(s): VIDEO ASSISTED THORACOSCOPY (VATS)/WEDGE RESECTION (Left) possible SEGMENTECTOMY (Left) possible LOBECTOMY (Left) as a surgical intervention .  The patient's history has been reviewed, patient examined, no change in status, stable for surgery.  I have reviewed the patient's chart and labs.  Questions were answered to the patient's satisfaction.     Rachael Villanueva

## 2017-08-13 NOTE — Anesthesia Procedure Notes (Signed)
Procedure Name: Intubation Date/Time: 08/13/2017 11:56 AM Performed by: Lowella Dell, CRNA Pre-anesthesia Checklist: Patient identified, Emergency Drugs available, Suction available and Patient being monitored Patient Re-evaluated:Patient Re-evaluated prior to induction Oxygen Delivery Method: Circle System Utilized Preoxygenation: Pre-oxygenation with 100% oxygen Induction Type: IV induction Ventilation: Mask ventilation without difficulty Laryngoscope Size: Mac and 3 Grade View: Grade I Tube type: Oral Endobronchial tube: EBT position confirmed by auscultation, Double lumen EBT and EBT position confirmed by fiberoptic bronchoscope and 37 Fr Number of attempts: 1 Airway Equipment and Method: Stylet Placement Confirmation: ETT inserted through vocal cords under direct vision,  positive ETCO2 and breath sounds checked- equal and bilateral Secured at: 27 cm Tube secured with: Tape Dental Injury: Teeth and Oropharynx as per pre-operative assessment  Comments: ETView DLT utilized

## 2017-08-13 NOTE — Progress Notes (Signed)
Patient ID: Rachael Villanueva, female   DOB: Oct 04, 1950, 67 y.o.   MRN: [id number] EVENING ROUNDS NOTE :     [address]       [place] [address]             [phone]                 Day of Surgery Procedure(s) (LRB): VIDEO ASSISTED THORACOSCOPY (VATS)/ LOBECTOMY (Left)  Total Length of Stay:  LOS: 0 days  BP (!) 103/56   Pulse 78   Temp (!) 97.3 F (36.3 C)   Resp (!) 25   Ht 5' (1.524 m)   Wt 109 lb 3.2 oz (49.5 kg)   SpO2 100%   BMI 21.33 kg/m   .Intake/Output      02/04 0701 - 02/05 0700   I.V. (mL/kg) 1802 (36.4)   Total Intake(mL/kg) 1802 (36.4)   Urine (mL/kg/hr) 275   Blood 50   Chest Tube 90   Total Output 415   Net +1387         . bupivacaine ON-Q pain pump    . [START ON 08/14/2017] cefUROXime (ZINACEF)  IV    . dextrose 5 % and 0.45% NaCl 100 mL/hr at 08/13/17 1712  . potassium chloride    . vancomycin       Lab Results  Component Value Date   WBC 8.0 08/09/2017   HGB 13.5 08/09/2017   HCT 40.9 08/09/2017   PLT 276 08/09/2017   GLUCOSE 111 (H) 08/09/2017   ALT 21 08/09/2017   AST 23 08/09/2017   NA 136 08/09/2017   K 3.9 08/09/2017   CL 104 08/09/2017   CREATININE 0.65 08/09/2017   BUN 19 08/09/2017   CO2 19 (L) 08/09/2017   TSH 7.727 (H) 12/06/2006   INR 0.88 08/09/2017   Stable after surgery today, small air leak   Grace Isaac MD  Beeper [phone] Office [phone] 08/13/2017 7:19 PM

## 2017-08-13 NOTE — Transfer of Care (Signed)
Immediate Anesthesia Transfer of Care Note  Patient: Rachael Villanueva  Procedure(s) Performed: VIDEO ASSISTED THORACOSCOPY (VATS)/ LOBECTOMY (Left Chest)  Patient Location: PACU  Anesthesia Type:General  Level of Consciousness: awake, alert  and oriented  Airway & Oxygen Therapy: Patient Spontanous Breathing and Patient connected to nasal cannula oxygen  Post-op Assessment: Report given to RN and Post -op Vital signs reviewed and stable  Post vital signs: Reviewed and stable  Last Vitals:  Vitals:   08/13/17 1125 08/13/17 1130  BP: (!) 102/51 110/71  Pulse: (!) 48 (!) 59  Resp: 15 14  Temp:    SpO2: 97% 99%    Last Pain:  Vitals:   08/13/17 1022  TempSrc: Oral      Patients Stated Pain Goal: 2 (84/16/60 6301)  Complications: No apparent anesthesia complications

## 2017-08-14 ENCOUNTER — Other Ambulatory Visit: Payer: Self-pay

## 2017-08-14 ENCOUNTER — Encounter (HOSPITAL_COMMUNITY): Payer: Self-pay | Admitting: Thoracic Surgery (Cardiothoracic Vascular Surgery)

## 2017-08-14 ENCOUNTER — Inpatient Hospital Stay (HOSPITAL_COMMUNITY): Payer: Medicare HMO

## 2017-08-14 LAB — BLOOD GAS, ARTERIAL
ACID-BASE EXCESS: 3.6 mmol/L — AB (ref 0.0–2.0)
BICARBONATE: 28.3 mmol/L — AB (ref 20.0–28.0)
Drawn by: 41977
O2 Content: 2 L/min
O2 Saturation: 97.1 %
PH ART: 7.389 (ref 7.350–7.450)
PO2 ART: 101 mmHg (ref 83.0–108.0)
Patient temperature: 98.6
pCO2 arterial: 47.9 mmHg (ref 32.0–48.0)

## 2017-08-14 LAB — CBC
HCT: 34.9 % — ABNORMAL LOW (ref 36.0–46.0)
HEMOGLOBIN: 11.2 g/dL — AB (ref 12.0–15.0)
MCH: 29.6 pg (ref 26.0–34.0)
MCHC: 32.1 g/dL (ref 30.0–36.0)
MCV: 92.3 fL (ref 78.0–100.0)
Platelets: 216 10*3/uL (ref 150–400)
RBC: 3.78 MIL/uL — AB (ref 3.87–5.11)
RDW: 13.9 % (ref 11.5–15.5)
WBC: 9.6 10*3/uL (ref 4.0–10.5)

## 2017-08-14 LAB — BASIC METABOLIC PANEL
ANION GAP: 9 (ref 5–15)
BUN: 6 mg/dL (ref 6–20)
CALCIUM: 8.3 mg/dL — AB (ref 8.9–10.3)
CHLORIDE: 104 mmol/L (ref 101–111)
CO2: 25 mmol/L (ref 22–32)
Creatinine, Ser: 0.63 mg/dL (ref 0.44–1.00)
GFR calc Af Amer: >60 mL/min (ref >60)
GFR calc non Af Amer: >60 mL/min (ref >60)
GLUCOSE: 114 mg/dL — AB (ref 65–99)
Potassium: 3.2 mmol/L — ABNORMAL LOW (ref 3.5–5.1)
Sodium: 138 mmol/L (ref 135–145)

## 2017-08-14 LAB — GLUCOSE, CAPILLARY
Glucose-Capillary: 103 mg/dL — ABNORMAL HIGH (ref 65–99)
Glucose-Capillary: 98 mg/dL (ref 65–99)

## 2017-08-14 MED ORDER — ARMODAFINIL 250 MG PO TABS
250.0000 mg | ORAL_TABLET | Freq: Every day | ORAL | Status: DC
  Administered 2017-08-15 – 2017-08-19 (×4): 250 mg via ORAL
  Filled 2017-08-14 (×3): qty 1

## 2017-08-14 MED ORDER — MODAFINIL 200 MG PO TABS: 250.0000 mg | ORAL_TABLET | Freq: Every day | ORAL | Status: DC

## 2017-08-14 MED ORDER — ARMODAFINIL 250 MG PO TABS: 250.0000 mg | ORAL_TABLET | Freq: Every day | ORAL | Status: DC

## 2017-08-14 NOTE — Progress Notes (Addendum)
[facility] DAILY ICU PROGRESS NOTE                   [address]            [place] [address]          [phone]   1 Day Post-Op Procedure(s) (LRB): VIDEO ASSISTED THORACOSCOPY (VATS)/ LOBECTOMY (Left)  Total Length of Stay:  LOS: 1 day   Subjective: Patient with some incisional pain.  Objective: Vital signs in last 24 hours: Temp:  [97.3 F (36.3 C)-98.6 F (37 C)] 98.6 F (37 C) (02/05 0725) Pulse Rate:  [48-83] 70 (02/05 0700) Cardiac Rhythm: Normal sinus rhythm (02/05 0400) Resp:  [11-25] 15 (02/05 0700) BP: (102-135)/(51-79) 103/56 (02/04 1700) SpO2:  [93 %-100 %] 96 % (02/05 0700) Arterial Line BP: (109-152)/(50-84) 109/50 (02/05 0700) Weight:  [109 lb 3.2 oz (49.5 kg)] 109 lb 3.2 oz (49.5 kg) (02/04 1022)  Filed Weights   08/13/17 1022  Weight: 109 lb 3.2 oz (49.5 kg)      Intake/Output from previous day: 02/04 0701 - 02/05 0700 In: 3392 [P.O.:240; I.V.:3102; IV Piggyback:50] Out: 2315 [Urine:2065; Blood:50; Chest Tube:200]  Intake/Output this shift: No intake/output data recorded.  Current Meds: Scheduled Meds: . acetaminophen  1,000 mg Oral Q6H   Or  . acetaminophen (TYLENOL) oral liquid 160 mg/5 mL  1,000 mg Oral Q6H  . bisacodyl  10 mg Oral Daily  . chlorhexidine  15 mL Mouth/Throat QHS  . enoxaparin (LOVENOX) injection  40 mg Subcutaneous Daily  . fentaNYL   Intravenous Q4H  . insulin aspart  0-24 Units Subcutaneous Q4H  . levothyroxine  112 mcg Oral QAC breakfast  . senna-docusate  1 tablet Oral QHS  . traZODone  50 mg Oral QHS  . vortioxetine HBr  20 mg Oral QODAY   Continuous Infusions: . bupivacaine ON-Q pain pump    . cefUROXime (ZINACEF)  IV Stopped (08/14/17 0031)  . dextrose 5 % and 0.45% NaCl 100 mL/hr at 08/14/17 0700  . potassium chloride     PRN Meds:.ALPRAZolam, diphenhydrAMINE **OR** diphenhydrAMINE, fentaNYL (SUBLIMAZE) injection, naloxone **AND** sodium chloride flush, ondansetron (ZOFRAN) IV, oxyCODONE,  potassium chloride  General appearance: alert, cooperative and no distress Heart: RRR Lungs: Coarse on the left and clear on the right Abdomen: Soft, non tender, bowel sounds present Extremities: No LE edema Wound: Clean and dry. Chest tube dressing with bloody ooze  Lab Results: CBC:No results for input(s): WBC, HGB, HCT, PLT in the last 72 hours. BMET: No results for input(s): NA, K, CL, CO2, GLUCOSE, BUN, CREATININE, CALCIUM in the last 72 hours.  CMET: Lab Results  Component Value Date   WBC 8.0 08/09/2017   HGB 13.5 08/09/2017   HCT 40.9 08/09/2017   PLT 276 08/09/2017   GLUCOSE 111 (H) 08/09/2017   ALT 21 08/09/2017   AST 23 08/09/2017   NA 136 08/09/2017   K 3.9 08/09/2017   CL 104 08/09/2017   CREATININE 0.65 08/09/2017   BUN 19 08/09/2017   CO2 19 (L) 08/09/2017   TSH 7.727 (H) 12/06/2006   INR 0.88 08/09/2017      PT/INR: No results for input(s): LABPROT, INR in the last 72 hours. Radiology: Dg Chest 2 View  Result Date: 08/13/2017 CLINICAL DATA:  Preoperative evaluation for upcoming lung nodule removal EXAM: CHEST  2 VIEW COMPARISON:  08/03/2016 FINDINGS: Cardiac shadow is within normal limits. Lungs are well aerated bilaterally. Bilateral nipple shadows are noted. Nodular density  is noted in the left mid lung laterally corresponding to that seen on prior CT examination. No acute bony abnormality is noted. IMPRESSION: Stable left upper lobe nodule. Electronically Signed   By: Inez Catalina M.D.   On: 08/13/2017 10:26   Dg Chest Port 1 View  Result Date: 08/14/2017 CLINICAL DATA:  Status post partial left lobectomy. EXAM: PORTABLE CHEST 1 VIEW COMPARISON:  Portable chest x-ray of August 13, 2017 FINDINGS: The left lung is well expanded. No definite pneumothorax is observed. The chest tube tip projects in the left apex. Surgical suture is visible in the mid and upper left hemithorax and in the left perihilar region. The S. left hilar soft tissues are mildly prominent.  The right lung is well-expanded and clear. The heart and pulmonary vascularity are normal. There is calcification in the wall of the aortic arch. The right internal jugular venous catheter tip projects over the lower third of the SVC. IMPRESSION: Status post left thoracotomy. No definite pneumothorax visible today. The support tubes are in stable position. Thoracic aortic atherosclerosis. Electronically Signed   By: David  Martinique M.D.   On: 08/14/2017 07:03   Dg Chest Port 1 View  Result Date: 08/13/2017 CLINICAL DATA:  Status post VATS on the left EXAM: PORTABLE CHEST 1 VIEW COMPARISON:  08/13/2017 FINDINGS: Cardiac shadow is stable. Aortic calcifications are again seen. Right jugular central line is noted in satisfactory position. Left-sided thoracostomy catheter is seen. Postsurgical changes are noted without evidence of pneumothorax previously seen nodule has been removed in the interval. No other focal abnormality is seen. IMPRESSION: Postsurgical changes as described. Electronically Signed   By: Inez Catalina M.D.   On: 08/13/2017 15:59     Assessment/Plan: S/P Procedure(s) (LRB): VIDEO ASSISTED THORACOSCOPY (VATS)/ LOBECTOMY (Left)  1. CV-SR in the 60-70's. 2. Pulmonary-Chest tube with >200 cc since surgery. Chest tube is to suction. There is a +1 air leak with cough. CXR this am shows patient is rotated to the left, subcutaneous emphysema left lateral chest wall,no pneumothorax. Hope to place chest tube to water seal in am. Encourage incentive spirometer. Check CXR in am. 3. Remove a line 4. CBGs 134/103/98. No history of diabetes. Stop accu checks and SS 5. Transfer to [place] PA-C 08/14/2017 7:58 AM

## 2017-08-14 NOTE — Progress Notes (Signed)
Report called to [employer]. Patient transferring to [facility] room 5. Husband at bedside, all personal items accounted for. Pharmacy called to notify of transfer so they can send home meds to proper unit.

## 2017-08-14 NOTE — Op Note (Signed)
Rachael Villanueva, Rachael Villanueva              ACCOUNT NO.:  [id number]  MEDICAL RECORD NO.:  [id number]  LOCATION:                                 FACILITY:  PHYSICIAN:  Revonda Standard. Roxan Hockey, M.D. DATE OF BIRTH:  DATE OF PROCEDURE:  08/13/2017 DATE OF DISCHARGE:                              OPERATIVE REPORT   PREOPERATIVE DIAGNOSIS:  Left upper lobe nodule.  POSTOPERATIVE DIAGNOSIS:  Adenocarcinoma, left upper lobe, clinical stage IA.  PROCEDURE:   Left video-assisted thoracoscopy, Wedge resection of left upper lobe nodule, Thoracoscopic lingular segmentectomy, Mediastinal lymph node sampling,  On-Q local anesthetic catheter placement.  SURGEON:  Revonda Standard. Roxan Hockey, M.D.  ASSISTANT:  Nicholes Rough, PA.  ANESTHESIA:  General.  FINDINGS:  Nodule easily palpable on the lateral aspect of the superior portion of the lingular segment of the left upper lobe.  Frozen section revealed adenocarcinoma.  CLINICAL NOTE:  Rachael Villanueva is a 67 year old woman with a history of tobacco abuse.  She was initially found to have a lung nodule on a CT that was done to rule out pulmonary embolus back in January of 2018.  A repeat CT showed the nodule had increased in size and was more solid in appearance.  On PET, the nodule was hypermetabolic with an SUV of 2. She was advised to undergo left VATS for wedge resection for diagnostic purposes and then either segmentectomy or lobectomy for definitive treatment if the nodule was cancerous.  The indications, risks, benefits, and alternatives were discussed in detail with the patient. She understood and accepted the risks and agreed to proceed.  OPERATIVE NOTE:  Rachael Villanueva was brought to the preoperative holding area on August 13, 2017.  Anesthesia placed an arterial blood pressure monitoring line and a central venous line.  She was taken to the operating room, anesthetized, and intubated with a double-lumen endotracheal tube.  Intravenous antibiotics  were administered.  A Foley catheter was placed.  Sequential compression devices were placed on the calves for DVT prophylaxis.  She was placed in a right lateral decubitus position and the left chest was prepped and draped in usual sterile fashion.  Single lung ventilation of the right lung was initiated and was tolerated well throughout the procedure.  After performing a time-out, an incision was made in the seventh interspace in the midaxillary line.  A 5-mm port was inserted into the chest and the thoracoscope was advanced into the chest.  A 5-cm working incision was made in the fourth interspace anterolaterally.  No rib spreading was performed during the procedure.  The upper lobe was grasped and the nodule was easily palpable in the lateral aspect of the lingular segment.  A wedge resection was performed with sequential firings of an Echelon powered 60-mm stapler with gold cartridges.  The specimen was sent for frozen section.  It was removed from the chest in an endoscopic retrieval bag.  While awaiting the results of the frozen section, an On-Q local anesthetic catheter was tunneled through a separate stab incision.  It was tunneled into a subpleural location and primed with 5 mL of 0.5% Marcaine.  It was secured to skin with a 3-0 silk suture.  The frozen  section returned showing adenocarcinoma and the decision was made to proceed with segmentectomy as discussed with the patient preoperatively.  The inferior ligament was divided.  All lymph nodes that were encountered during the dissection were removed and sent as separate specimens for permanent pathology. The pleural reflection was divided posteriorly and level 7 nodes were removed.  The pleural reflection was divided anteriorly.  The fissures were incomplete.  A stapler was used to complete a portion of the anterior major fissure to then allow identification of the pulmonary artery branches to the lower lobe.  There were some  small 1 mm branches to the distal portion of the lingula, which were divided with electrocautery.  The dissection of the fissure was carried up along the pulmonary artery to the lower lobe with sequential firings of the stapler.  Only small areas of the parenchyma could be divided at given time.  This dissection was relatively tedious.  The lingular arterial branch was identified.  The lingular veins then were encircled and divided with the endoscopic vascular stapler.  The lingular artery was dissected out and it was divided with the endoscopic vascular stapler as well.  Nodes were dissected off the lingular segmental bronchus, which was encircled.  The stapler was placed across the lingular bronchus.  This was the 60-mm stapler using a green cartridge.  The stapler was closed, but not fired.  A test inflation showed good aeration of the lower lobe as well as the superior portion of the upper lobe including the anterior and apical posterior segments.  The stapler then was fired transecting the bronchus.  The line for segmentectomy was apparent as there was still air in the nonlingular portion of the upper lobe.  The parenchymal division was accomplished with sequential firings of the Echelon stapler using gold cartridges. The stapler was placed a separate port incision posteriorly for this staple line.  The specimen was placed into an endoscopic retrieval bag, removed, and sent for a frozen section on the bronchial margin which returned with no tumor seen.  The pleura overlying the aortopulmonary window then was incised and level 5 and 6 nodes were identified, removed, and sent for permanent pathology.  The chest was copiously irrigated with warm saline.  The test inflation at 30 cm of water revealed no leakage from the bronchial stump.  There was some leakage from the fissure from the parenchymal dissection.  Progel was applied to this area.  A 28-French chest tube was placed through  the original port incision and secured to the skin with a #1 silk suture. The left lung was reinflated.  The posterior port incision was closed with a #1 Vicryl fascial suture and a 3-0 Vicryl subcuticular suture. The working incision was closed in standard fashion with a #1 Vicryl fascial suture followed by 2-0 Vicryl subcutaneous suture and 3-0 Vicryl subcuticular suture.  Dermabond was applied to the incisions.  The patient was placed back in a supine position.  She was extubated in the operating room and taken to the postanesthetic care unit in good condition.     Revonda Standard Roxan Hockey, M.D.     SCH/MEDQ  D:  08/13/2017  T:  08/14/2017  Job:  [id number]

## 2017-08-14 NOTE — Care Management Note (Signed)
Case Management Note  Patient Details  Name: Rachael Villanueva MRN: [id number] Date of Birth: 05/26/1951  Subjective/Objective:  From home with spouse, pta indep, spouse will be with patient 24/7 for assistance.  She has a PCP and medication coverage.   POD 1 VATS, lobectomy.                   Action/Plan: NCM will follow for dc needs.  Expected Discharge Date:  08/18/17               Expected Discharge Plan:  Home/Self Care  In-House Referral:     Discharge planning Services  CM Consult  Post Acute Care Choice:    Choice offered to:     DME Arranged:    DME Agency:     HH Arranged:    HH Agency:     Status of Service:  In process, will continue to follow  If discussed at Long Length of Stay Meetings, dates discussed:    Additional Comments:  Zenon Mayo, RN 08/14/2017, 11:26 AM

## 2017-08-15 ENCOUNTER — Inpatient Hospital Stay (HOSPITAL_COMMUNITY): Payer: Medicare HMO

## 2017-08-15 LAB — COMPREHENSIVE METABOLIC PANEL
ALT: 16 U/L (ref 14–54)
ANION GAP: 10 (ref 5–15)
AST: 21 U/L (ref 15–41)
Albumin: 2.9 g/dL — ABNORMAL LOW (ref 3.5–5.0)
Alkaline Phosphatase: 73 U/L (ref 38–126)
BILIRUBIN TOTAL: 0.6 mg/dL (ref 0.3–1.2)
BUN: 8 mg/dL (ref 6–20)
CALCIUM: 8.5 mg/dL — AB (ref 8.9–10.3)
CO2: 26 mmol/L (ref 22–32)
Chloride: 104 mmol/L (ref 101–111)
Creatinine, Ser: 0.67 mg/dL (ref 0.44–1.00)
GFR calc Af Amer: >60 mL/min (ref >60)
Glucose, Bld: 103 mg/dL — ABNORMAL HIGH (ref 65–99)
POTASSIUM: 4 mmol/L (ref 3.5–5.1)
Sodium: 140 mmol/L (ref 135–145)
Total Protein: 5.1 g/dL — ABNORMAL LOW (ref 6.5–8.1)

## 2017-08-15 LAB — CBC
HEMATOCRIT: 37 % (ref 36.0–46.0)
Hemoglobin: 11.6 g/dL — ABNORMAL LOW (ref 12.0–15.0)
MCH: 29.5 pg (ref 26.0–34.0)
MCHC: 31.4 g/dL (ref 30.0–36.0)
MCV: 94.1 fL (ref 78.0–100.0)
Platelets: 207 10*3/uL (ref 150–400)
RBC: 3.93 MIL/uL (ref 3.87–5.11)
RDW: 14 % (ref 11.5–15.5)
WBC: 9.5 10*3/uL (ref 4.0–10.5)

## 2017-08-15 MED ORDER — ALPRAZOLAM 0.25 MG PO TABS
0.2500 mg | ORAL_TABLET | Freq: Two times a day (BID) | ORAL | Status: DC | PRN
  Administered 2017-08-15 – 2017-08-18 (×3): 0.25 mg via ORAL
  Filled 2017-08-15 (×5): qty 1

## 2017-08-15 MED ORDER — VORTIOXETINE HBR 5 MG PO TABS
20.0000 mg | ORAL_TABLET | ORAL | Status: DC
  Administered 2017-08-15: 20 mg via ORAL
  Filled 2017-08-15 (×2): qty 4

## 2017-08-15 MED ORDER — GUAIFENESIN ER 600 MG PO TB12
600.0000 mg | ORAL_TABLET | Freq: Two times a day (BID) | ORAL | Status: DC
  Administered 2017-08-15 – 2017-08-19 (×7): 600 mg via ORAL
  Filled 2017-08-15 (×8): qty 1

## 2017-08-15 NOTE — Progress Notes (Signed)
Patient has slightly increased subcutaneous air upon palpation. She has no increased work of breathing, is on room air O2 sats 98, and has no other symptoms. PA notified and patient was placed back on suction. Will continue to monitor closely.

## 2017-08-15 NOTE — Progress Notes (Addendum)
      [address]       [place] [address]             [phone]       2 Days Post-Op Procedure(s) (LRB): VIDEO ASSISTED THORACOSCOPY (VATS)/ LOBECTOMY (Left)  Subjective: Patient has some incisional pain.   Objective: Vital signs in last 24 hours: Temp:  [98.1 F (36.7 C)-99.8 F (37.7 C)] 98.8 F (37.1 C) (02/06 0416) Pulse Rate:  [65-89] 73 (02/06 0416) Cardiac Rhythm: Normal sinus rhythm (02/06 0700) Resp:  [17-32] 17 (02/06 0416) BP: (109-154)/(74-92) 109/74 (02/06 0416) SpO2:  [93 %-99 %] 98 % (02/06 0416) Arterial Line BP: (89-133)/(68-86) 89/81 (02/05 1200)      Intake/Output from previous day: 02/05 0701 - 02/06 0700 In: 1220 [P.O.:780; I.V.:440] Out: 3166 [Urine:2975; Stool:1; Chest Tube:190]   Physical Exam:  Cardiovascular: RRR Pulmonary: Clear to auscultation to right and coarse on the left Abdomen: Soft, non tender, bowel sounds present. Extremities: No lower extremity edema. Wounds: Clean and dry.  No erythema or signs of infection. Chest Tube: to suction, air leak 1-2  Lab Results: CBC: Recent Labs    08/14/17 0815 08/15/17 0414  WBC 9.6 9.5  HGB 11.2* 11.6*  HCT 34.9* 37.0  PLT 216 207   BMET:  Recent Labs    08/14/17 0815 08/15/17 0555  NA 138 140  K 3.2* 4.0  CL 104 104  CO2 25 26  GLUCOSE 114* 103*  BUN 6 8  CREATININE 0.63 0.67  CALCIUM 8.3* 8.5*    PT/INR: No results for input(s): LABPROT, INR in the last 72 hours. ABG:  INR: Will add last result for INR, ABG once components are confirmed Will add last 4 CBG results once components are confirmed  Assessment/Plan: 1. CV-SR in the 60's. 2. Pulmonary-Chest tube with 190 cc since surgery. Chest tube is to suction. There is a +1-2 air leak. CXR this am appears stable. Hope to place chest tube to water seal soon. Encourage incentive spirometer. Check CXR in am. Final pathology results pending.     Donielle M ZimmermanPA-C 08/15/2017,7:54 AM    Patient seen and examined, agree with above Small air leak- will try on water seal Ambulate in hall BID On SCD + enoxaparin Path still pending  Remo Lipps C. Roxan Hockey, MD [facility] [phone]

## 2017-08-15 NOTE — Progress Notes (Signed)
2cc of fentanyl wasted in sink with Rosalita Chessman

## 2017-08-16 ENCOUNTER — Other Ambulatory Visit: Payer: Self-pay

## 2017-08-16 ENCOUNTER — Inpatient Hospital Stay (HOSPITAL_COMMUNITY): Payer: Medicare HMO

## 2017-08-16 DIAGNOSIS — C3492 Malignant neoplasm of unspecified part of left bronchus or lung: Secondary | ICD-10-CM

## 2017-08-16 MED ORDER — LACTULOSE 10 GM/15ML PO SOLN
20.0000 g | Freq: Once | ORAL | Status: AC
  Administered 2017-08-16: 20 g via ORAL
  Filled 2017-08-16: qty 30

## 2017-08-16 NOTE — Discharge Summary (Addendum)
Physician Discharge Summary  Patient ID: Rachael Villanueva MRN: [id number] DOB/AGE: [birth year] 67 y.o.  Admit date: 08/13/2017 Discharge date: 08/19/2017  Admission Diagnosis: Left upper lobe nodule  Patient Active Problem List   Diagnosis Date Noted  . Adenocarcinoma LUL 07/23/2017  . Other chest pain 08/03/2016  . Acute pain of left shoulder 08/03/2016  . Closed fracture fibula, head, left, initial encounter 06/29/2016  . Closed displaced fracture of lateral condyle of left tibia 06/29/2016  . [place] DISEASE 09/06/2006  . HYPOTHYROIDISM, UNSPECIFIED 09/06/2006  . ANXIETY 09/06/2006  . TOBACCO DEPENDENCE 09/06/2006  . DEPRESSIVE DISORDER, NOS 09/06/2006  . RHINITIS, ALLERGIC 09/06/2006  . INSOMNIA NOS 09/06/2006  Brief post op afib/flutter  Discharge Diagnoses: Adenocarcinoma left upper lobe- stage IIA (T1N1)  Patient Active Problem List   Diagnosis Date Noted  . Adenocarcinoma, lung, left ([place]) 08/16/2017  . S/P partial lobectomy of lung 08/13/2017  . Lung nodule 07/23/2017  . Other chest pain 08/03/2016  . Acute pain of left shoulder 08/03/2016  . Closed fracture fibula, head, left, initial encounter 06/29/2016  . Closed displaced fracture of lateral condyle of left tibia 06/29/2016  . [place] DISEASE 09/06/2006  . HYPOTHYROIDISM, UNSPECIFIED 09/06/2006  . ANXIETY 09/06/2006  . TOBACCO DEPENDENCE 09/06/2006  . DEPRESSIVE DISORDER, NOS 09/06/2006  . RHINITIS, ALLERGIC 09/06/2006  . INSOMNIA NOS 09/06/2006   Discharged Condition:  Stable  Pathology: 1. Lung, wedge biopsy/resection, Left Upper Lobe - ADENOCARCINOMA, 0.8 CM. - MARGINS NOT INVOLVED. - SEE ONCOLOGY TABLE. 2. Lung, resection (segmental or lobe), Left Upper Lobe Lingular Segment - BENIGN LUNG WITH PREVIOUS BIOPSY SITE. - BRONCHIAL MARGIN FREE OF TUMOR. - NO EVIDENCE OF MALIGNANCY. 3. Lymph node, biopsy, Level 9 - ANTHRACOTIC LYMPH NODE. - NO EVIDENCE OF MALIGNANCY. 4. Lymph node, biopsy, Level 7 -  ANTHRACOTIC LYMPH NODE. - NO EVIDENCE OF MALIGNANCY. 5. Lymph node, biopsy, 11 L - ANTHRACOTIC LYMPH NODE. - NO EVIDENCE OF MALIGNANCY. 6. Lymph node, biopsy, 12 L - METASTATIC ADENOCARCINOMA IN ONE LYMPH NODE (1/1). 7. Lymph node, biopsy, Level 5 - ANTHRACOTIC LYMPH NODE. - NO EVIDENCE OF MALIGNANCY. 8. Lymph node, biopsy, Level 6 - ANTHRACOTIC LYMPH NODE. - NO EVIDENCE OF MALIGNANCY. TNM code: pT1a, pN1  History of Present Illness:  Mrs. Cleland is a 67 year old woman with a past medical history significant for hypothyroidism status post ablation for Graves' disease, anxiety, hyperparathyroidism treated with parathyroidectomy, tobacco abuse, and emphysema.  She is vague about her smoking history but has smoked at least half pack of cigarettes for 50 years but at times has smoked more than that.  In December 2017 she fractured her knee.  In January 2018 she was having shoulder pain.  A CT angiogram of the chest was done to rule out pulmonary embolus.  That showed a 5.8 mm left upper lobe groundglass opacity.  She recently saw Dr. Marin Comment.  A repeat CT was done to follow-up the nodule.  The nodule had increased in size to 8 mm and was more solid in appearance.  A PET/CT showed the nodule was hypermetabolic with an SUV of 2.0. Due to this she was referred to Dr. Roxan Hockey for surgical resection.  He evaluated the patient and was in agreement VATS would be indicated with resection of the nodule.  The risks and benefits of the procedure were explained to the patient and she was agreeable to proceed.  Hospital Course:   Mrs. Krogstad presented to [facility] on 08[facility]13/2017.  She was taken to the  operating room and underwent Left VATS with wedge resection Left upper lobe, lingular segmentectomy, lymph node sampling, and insertion of ON-Q catheter.  She tolerated the procedure without difficulty, was extubated and taken to the SICU in stable condition.  Patient did well post operatively.  Her chest  tube had an air leak post operatively.  Her CXR was free from pneumothorax, but did demonstrate subcutaneous emphysema.  Her arterial line was removed without difficulty.  Her air leak persisted but improved.  Her chest tube was transitioned to water seal on POD #2.  However she developed worsening subcutaneous emphysema and was placed back on suction.  Follow up CXR remained stable, without evidence of air leak on POD #3.  Her chest tube was again transitioned to water seal.  Chest tube was removed on 08/17/2017. Follow up CXR showed no pneumothorax, stable subcutaneous emphysema left lateral chest wall.  Her pathology revealed Adenocarcinoma stage IIA.  She will be referred to Oncology on an outpatient basis.  She did have a fib/flutter the evening of 02/07. She was put on Amiodarone and Toprol XL 25 mg daily. She had brief run again 02/09 but was mostly in [place]. She remains clinically stable.  Her pain is well controlled.  Her incision is healing without difficulty.  She had nausea on 02/09. She had not had a bowel movement in a week, despite laxatives and prune juice. Her Amiodarone was decreased as HR in the 60-70's. She was given a fleet enema. She finally had a bowel movement. She is medically stable for discharge home today.     Significant Diagnostic Studies: nuclear medicine:   1. Low-level hypermetabolism, corresponding to the similar-sized spiculated left upper lobe pulmonary nodule. This level of hypermetabolism, given small nodule size, is suspicious for primary bronchogenic carcinoma. Consider tissue sampling. If sampling is not performed, recommend CT followup at 3 months. 2. No evidence of hypermetabolic thoracic nodal or extrathoracic disease. 3. Coronary artery atherosclerosis. Aortic Atherosclerosis (ICD10-I70.0). 4. Sinus disease.  Treatments: surgery:    Left video-assisted thoracoscopy, wedge resection of left upper lobe nodule, thoracoscopic lingular segmentectomy, mediastinal  lymph node sampling, On-Q local anesthetic catheter placement by Dr. Roxan Hockey on 08/13/2017.  Disposition: 01-Home or Self Care   Allergies as of 08/19/2017   No Known Allergies     Medication List    TAKE these medications   ALPRAZolam 0.5 MG tablet Commonly known as:  XANAX Take 0.25-0.5 mg by mouth at bedtime as needed for anxiety or sleep.   amiodarone 200 MG tablet Commonly known as:  PACERONE Take 1 tablet (200 mg total) by mouth daily.   Armodafinil 250 MG tablet Take 250 mg by mouth daily.   chlorhexidine 0.12 % solution Commonly known as:  PERIDEX Use as directed 15 mLs in the mouth or throat at bedtime.   ergocalciferol 50000 units capsule Commonly known as:  VITAMIN D2 Take 50,000 Units by mouth once a week.   guaiFENesin 600 MG 12 hr tablet Commonly known as:  MUCINEX Take 1 tablet (600 mg total) by mouth 2 (two) times daily.   levothyroxine 112 MCG tablet Commonly known as:  SYNTHROID, LEVOTHROID Take 112 mcg by mouth daily before breakfast.   metoprolol succinate 25 MG 24 hr tablet Commonly known as:  TOPROL-XL Take 1 tablet (25 mg total) by mouth daily.   oxyCODONE 5 MG immediate release tablet Commonly known as:  Oxy IR/ROXICODONE Take 1 tablet (5 mg total) by mouth every 4 (four) hours as needed for  severe pain.   SIMILASAN DRY EYE RELIEF OP Apply 1 drop to eye daily as needed (dry eyes).   traZODone 50 MG tablet Commonly known as:  DESYREL Take 50-100 mg by mouth at bedtime.   TRINTELLIX 20 MG Tabs Generic drug:  vortioxetine HBr Take 20 mg by mouth every other day.      Follow-up Information    Melrose Nakayama, MD. Go on 09/04/2017.   Specialty:  Cardiothoracic Surgery Why:  Appointment is at 9:30 am, please get PA/LAT CXR at 9:00 at [facility] located on first floor of our office building Contact information: [place] [address] [phone]           Signed: Nani Skillern PA-C 08/19/2017, 10:00 AM

## 2017-08-16 NOTE — Discharge Instructions (Signed)
Video-Assisted Thoracic Surgery, Care After ° °This sheet gives you information about how to care for yourself after your procedure. Your health care provider may also give you more specific instructions. If you have problems or questions, contact your health care provider. °What can I expect after the procedure? °After the procedure, it is common to have: °· Some pain and soreness in your chest. °· Pain when breathing in (inhaling) and coughing. °· Constipation. °· Fatigue. °· Difficulty sleeping. ° °Follow these instructions at home: °Preventing pneumonia °· Take deep breaths or do breathing exercises as instructed by your health care provider. Doing this helps prevent lung infection (pneumonia). °· Cough frequently. Coughing may cause discomfort, but it is important to clear mucus (phlegm) and expand your lungs. If it hurts to cough, hold a pillow against your chest or place the palms of both hands on top of the incision (use splinting) when you cough. This may help relieve discomfort. °· If you were given an incentive spirometer, use it as directed. An incentive spirometer is a tool that measures how well you are filling your lungs with each breath. °· Participate in pulmonary rehabilitation as directed by your health care provider. This is a program that combines education, exercise, and support from a team of specialists. The goal is to help you heal and get back to your normal activities as soon as possible. °Medicines °· Take over-the-counter or prescription medicines only as told by your health care provider. °· If you have pain, take pain-relieving medicine before your pain becomes severe. This is important because if your pain is under control, you will be able to breathe and cough more comfortably. °· If you were prescribed an antibiotic medicine, take it as told by your health care provider. Do not stop taking the antibiotic even if you start to feel better. °Activity °· Ask your health care provider  what activities are safe for you. °· Avoid activities that use your chest muscles for at least 3-4 weeks. °· Do not lift anything that is heavier than 10 lb (4.5 kg), or the limit that your health care provider tells you, until he or she says that it is safe. °Incision care °· Follow instructions from your health care provider about how to take care of your incision(s). Make sure you: °? Wash your hands with soap and water before you change your bandage (dressing). If soap and water are not available, use hand sanitizer. °? Change your dressing as told by your health care provider. °? Leave stitches (sutures), skin glue, or adhesive strips in place. These skin closures may need to stay in place for 2 weeks or longer. If adhesive strip edges start to loosen and curl up, you may trim the loose edges. Do not remove adhesive strips completely unless your health care provider tells you to do that. °· Keep your dressing dry until it has been removed. °· Check your incision area every day for signs of infection. Check for: °? Redness, swelling, or pain. °? Fluid or blood. °? Warmth. °? Pus or a bad smell. °Bathing °· Do not take baths, swim, or use a hot tub until your health care provider approves. You may take showers. °· After your dressing has been removed, use soap and water to gently wash your incision area. Do not use anything else to clean your incision(s) unless your health care provider tells you to do this. °Driving °· Do not drive until your health care provider approves. °· Do not drive   or use heavy machinery while taking prescription pain medicine. °Eating and drinking °· Eat a healthy, balanced diet as instructed by your health care provider. A healthy diet includes plenty of fresh fruits and vegetables, whole grains, and low-fat (lean) proteins. °· Limit foods that are high in fat and processed sugars, such as fried and sweet foods. °· Drink enough fluid to keep your urine clear or pale yellow. °General  instructions °· To prevent or treat constipation while you are taking prescription pain medicine, your health care provider may recommend that you: °? Take over-the-counter or prescription medicines. °? Eat foods that are high in fiber, such as beans, fresh fruits and vegetables, and whole grains. °· Do not use any products that contain nicotine or tobacco, such as cigarettes and e-cigarettes. If you need help quitting, ask your health care provider. °· Avoid secondhand smoke. °· Wear compression stockings as told by your health care provider. These stockings help to prevent blood clots and reduce swelling in your legs. °· If you have a chest tube, care for it as instructed by your health care provider. Do not travel by airplane during the 2 weeks after your chest tube is removed, or until your health care provider says that this is safe. °· Keep all follow-up visits as told by your health care provider. This is important. °Contact a health care provider if: °· You have redness, swelling, or pain around an incision. °· You have fluid or blood coming from an incision. °· Your incision area feels warm to the touch. °· You have pus or a bad smell coming from an incision. °· You have a fever or chills. °· You have nausea or vomiting. °· You have pain that does not get better with medicine. °Get help right away if: °· You have chest pain. °· Your heart is fluttering or beating rapidly. °· You develop a rash. °· You have shortness of breath or trouble breathing. °· You are confused. °· You have trouble speaking. °· You feel weak, light-headed, or dizzy. °· You faint. °Summary °· To help prevent lung infection (pneumonia), take deep breaths or do breathing exercises as instructed by your health care provider. °· Cough frequently to clear mucus (phlegm) and expand your lungs. If it hurts to cough, hold a pillow against your chest or place the palms of both hands on top of the incision (use splinting) when you cough. °· If  you have pain, take pain-relieving medicine before your pain becomes severe. This is important because if your pain is under control, you will be able to breathe and cough more comfortably. °· Ask your health care provider what activities are safe for you. °This information is not intended to replace advice given to you by your health care provider. Make sure you discuss any questions you have with your health care provider. °Document Released: 10/21/2012 Document Revised: 06/05/2016 Document Reviewed: 06/05/2016 °Elsevier Interactive Patient Education © 2017 [employer] ° °

## 2017-08-16 NOTE — Progress Notes (Addendum)
      [address]       [place] [address]             [phone]       3 Days Post-Op Procedure(s) (LRB): VIDEO ASSISTED THORACOSCOPY (VATS)/ LOBECTOMY (Left)  Subjective: Patient has some incisional pain and has not had a bowel movement yet.  Objective: Vital signs in last 24 hours: Temp:  [97.9 F (36.6 C)-98.9 F (37.2 C)] 98.7 F (37.1 C) (02/07 0519) Pulse Rate:  [50-84] 50 (02/07 0519) Cardiac Rhythm: Normal sinus rhythm (02/06 2338) Resp:  [17-23] 17 (02/07 0519) BP: (126-173)/(74-101) 134/82 (02/07 0519) SpO2:  [92 %-99 %] 95 % (02/07 0519)      Intake/Output from previous day: 02/06 0701 - 02/07 0700 In: 620 [P.O.:360; I.V.:260] Out: 140 [Chest Tube:140]   Physical Exam:  Cardiovascular: RRR Pulmonary: Clear to auscultation to right and coarse on the left Abdomen: Soft, non tender, bowel sounds present. Extremities: No lower extremity edema. Wounds: Clean and dry.  No erythema or signs of infection. Chest Tube: to suction, no air leak  Lab Results: CBC: Recent Labs    08/14/17 0815 08/15/17 0414  WBC 9.6 9.5  HGB 11.2* 11.6*  HCT 34.9* 37.0  PLT 216 207   BMET:  Recent Labs    08/14/17 0815 08/15/17 0555  NA 138 140  K 3.2* 4.0  CL 104 104  CO2 25 26  GLUCOSE 114* 103*  BUN 6 8  CREATININE 0.63 0.67  CALCIUM 8.3* 8.5*    PT/INR: No results for input(s): LABPROT, INR in the last 72 hours. ABG:  INR: Will add last result for INR, ABG once components are confirmed Will add last 4 CBG results once components are confirmed  Assessment/Plan: 1. CV-SR in the 80's. 2. Pulmonary-Chest tube with 140 cc last 24 hours. Chest tube was placed to water seal yesterday but early in afternoon, she developed increased subcutaneous emphysema. She was put back to suction. There is no air leak this am. CXR this am appears stable. Hope to place chest tube to water seal again today. Encourage incentive spirometer. Check CXR in am.    3. LOC constipation     Donielle M ZimmermanPA-C 08/16/2017,7:49 AM  Patient seen and examined, agree with above Dc central line Ambulate Ct to water seal Path T1N1- stage IIA, will refer to Oncology as an outpatient  Remo Lipps C. Roxan Hockey, MD [facility] [phone]

## 2017-08-16 NOTE — Progress Notes (Signed)
On-Q removed; new dressing applied to chest tube site; will continue to monitor.   [place]  Shemia Bevel, RN

## 2017-08-17 ENCOUNTER — Inpatient Hospital Stay (HOSPITAL_COMMUNITY): Payer: Medicare HMO

## 2017-08-17 MED ORDER — METOPROLOL SUCCINATE ER 25 MG PO TB24
25.0000 mg | ORAL_TABLET | Freq: Every day | ORAL | Status: DC
  Administered 2017-08-17 – 2017-08-19 (×3): 25 mg via ORAL
  Filled 2017-08-17 (×4): qty 1

## 2017-08-17 MED ORDER — AMIODARONE HCL 200 MG PO TABS
400.0000 mg | ORAL_TABLET | Freq: Two times a day (BID) | ORAL | Status: DC
  Administered 2017-08-17 (×2): 400 mg via ORAL
  Filled 2017-08-17 (×2): qty 2

## 2017-08-17 MED ORDER — AMIODARONE HCL IN DEXTROSE 360-4.14 MG/200ML-% IV SOLN
60.0000 mg/h | INTRAVENOUS | Status: AC
  Filled 2017-08-17: qty 200

## 2017-08-17 MED ORDER — FENTANYL 40 MCG/ML IV SOLN: INTRAVENOUS | Status: AC

## 2017-08-17 MED ORDER — AMIODARONE HCL IN DEXTROSE 360-4.14 MG/200ML-% IV SOLN
30.0000 mg/h | INTRAVENOUS | Status: AC
  Administered 2017-08-17: 30 mg/h via INTRAVENOUS
  Filled 2017-08-17: qty 200

## 2017-08-17 MED ORDER — VORTIOXETINE HBR 20 MG PO TABS
20.0000 mg | ORAL_TABLET | ORAL | Status: DC
  Administered 2017-08-17 – 2017-08-19 (×2): 20 mg via ORAL
  Filled 2017-08-17 (×2): qty 20

## 2017-08-17 NOTE — Progress Notes (Signed)
Dr. Roxan Hockey explained pt to remove chest tube today. Patient understood it. Removed Lt. Chest tube following by chest x-ray. D/C PCA and wasted fentanyl 14 ml witness by [employer]. HS [employer]

## 2017-08-17 NOTE — Progress Notes (Signed)
4 Days Post-Op Procedure(s) (LRB): VIDEO ASSISTED THORACOSCOPY (VATS)/ LOBECTOMY (Left) Subjective: Feels a little better today Says she is still having trouble processing pathology results  Objective: Vital signs in last 24 hours: Temp:  [97.8 F (36.6 C)-99.7 F (37.6 C)] 98 F (36.7 C) (02/07 2352) Pulse Rate:  [77-100] 100 (02/07 2352) Cardiac Rhythm: Normal sinus rhythm (02/08 0701) Resp:  [15-24] 20 (02/08 0509) BP: (96-130)/(74-89) 121/75 (02/07 2352) SpO2:  [94 %-99 %] 95 % (02/08 0509)  Hemodynamic parameters for last 24 hours:    Intake/Output from previous day: 02/07 0701 - 02/08 0700 In: 1152.9 [P.O.:838; I.V.:314.9] Out: 45 [Chest Tube:64] Intake/Output this shift: No intake/output data recorded.  General appearance: alert, cooperative and no distress Neurologic: intact and anxious Heart: regular rate and rhythm Lungs: clear to auscultation bilaterally Wound: clean and dry no air leak  Lab Results: Recent Labs    08/15/17 0414  WBC 9.5  HGB 11.6*  HCT 37.0  PLT 207   BMET:  Recent Labs    08/15/17 0555  NA 140  K 4.0  CL 104  CO2 26  GLUCOSE 103*  BUN 8  CREATININE 0.67  CALCIUM 8.5*    PT/INR: No results for input(s): LABPROT, INR in the last 72 hours. ABG    Component Value Date/Time   PHART 7.389 08/14/2017 0405   HCO3 28.3 (H) 08/14/2017 0405   O2SAT 97.1 08/14/2017 0405   CBG (last 3)  No results for input(s): GLUCAP in the last 72 hours.  Assessment/Plan: S/P Procedure(s) (LRB): VIDEO ASSISTED THORACOSCOPY (VATS)/ LOBECTOMY (Left) -CV- had atrial fib briefly overnight  Converted back to SR with amiodarone  Convert amiodarone to PO, start low dose Toprol  RESP- no air leak- dc chest tube  RENAL- creatinine and lytes OK  SCD + enoxaparin for DVT prophylaxis  Possibly home in AM if maintains SR   LOS: 4 days    Melrose Nakayama 08/17/2017

## 2017-08-17 NOTE — Progress Notes (Signed)
At 2315 CCMD called and stated pt had run of SVT and then went into a.fib/a flutter. Pt was asymptomatic. Completed an EKG, MD notified and ordered pt to be started on Amiodarone protocol. Pt remained in A. Fib until approx. 0620. Will continue to monitor pt.

## 2017-08-18 ENCOUNTER — Inpatient Hospital Stay (HOSPITAL_COMMUNITY): Payer: Medicare HMO

## 2017-08-18 MED ORDER — METOPROLOL SUCCINATE ER 25 MG PO TB24: 25.0000 mg | ORAL_TABLET | Freq: Every day | ORAL | 1 refills | Status: DC

## 2017-08-18 MED ORDER — GUAIFENESIN ER 600 MG PO TB12: 600.0000 mg | ORAL_TABLET | Freq: Two times a day (BID) | ORAL | Status: DC

## 2017-08-18 MED ORDER — AMIODARONE HCL 200 MG PO TABS
200.0000 mg | ORAL_TABLET | Freq: Every day | ORAL | Status: DC
  Administered 2017-08-19: 200 mg via ORAL
  Filled 2017-08-18: qty 1

## 2017-08-18 MED ORDER — POLYETHYLENE GLYCOL 3350 17 G PO PACK
17.0000 g | PACK | Freq: Once | ORAL | Status: AC
  Administered 2017-08-18: 17 g via ORAL
  Filled 2017-08-18: qty 1

## 2017-08-18 MED ORDER — AMIODARONE HCL 200 MG PO TABS: 200.0000 mg | ORAL_TABLET | Freq: Every day | ORAL | 1 refills | Status: DC

## 2017-08-18 MED ORDER — OXYCODONE HCL 5 MG PO TABS: 5.0000 mg | ORAL_TABLET | ORAL | 0 refills | Status: DC | PRN

## 2017-08-18 MED ORDER — AMIODARONE HCL 200 MG PO TABS
200.0000 mg | ORAL_TABLET | Freq: Two times a day (BID) | ORAL | Status: DC
  Administered 2017-08-18: 200 mg via ORAL
  Filled 2017-08-18: qty 1

## 2017-08-18 NOTE — Progress Notes (Addendum)
      [address]       [place] [address]             [phone]       5 Days Post-Op Procedure(s) (LRB): VIDEO ASSISTED THORACOSCOPY (VATS)/ LOBECTOMY (Left)  Subjective: Patient  has not had a bowel movement yet, despite lactulose. She also has nausea this am.  Objective: Vital signs in last 24 hours: Temp:  [97.9 F (36.6 C)-99.1 F (37.3 C)] 98 F (36.7 C) (02/09 0749) Pulse Rate:  [52-81] 65 (02/09 0749) Cardiac Rhythm: Normal sinus rhythm (02/09 0749) Resp:  [14-24] 24 (02/09 0749) BP: (99-138)/(53-74) 127/74 (02/09 0749) SpO2:  [95 %-100 %] 99 % (02/09 0749)      Intake/Output from previous day: 02/08 0701 - 02/09 0700 In: 477.9 [P.O.:200; I.V.:277.9] Out: -    Physical Exam:  Cardiovascular: RRR Pulmonary: Clear to auscultation to right and coarse on the left. Subcutaneous emphysema on back Abdomen: Soft, non tender, bowel sounds present. Extremities: No lower extremity edema. Wounds: Clean and dry.  No erythema or signs of infection.   Lab Results: CBC: No results for input(s): WBC, HGB, HCT, PLT in the last 72 hours. BMET:  No results for input(s): NA, K, CL, CO2, GLUCOSE, BUN, CREATININE, CALCIUM in the last 72 hours.  PT/INR: No results for input(s): LABPROT, INR in the last 72 hours. ABG:  INR: Will add last result for INR, ABG once components are confirmed Will add last 4 CBG results once components are confirmed  Assessment/Plan: 1. CV-Had brief a fib the evening of 02/07. SR in the 80's. On Amiodarone 400 mg bid and Toprol 25 mg daily. Will decrease Amiodarone and give parameters for Toprol XL. 2. Pulmonary-Chest tube removed yesterday. CXR this am shows There is no air leak this am. CXR this am shows no pneumothorax, stable left chest wall emphysema.  Encourage incentive spirometer.  3. LOC constipation 4. Will hod discharge today. Nausea could be from Amiodarone, constipation, or just given pain medication on "empty  stomach".   Donielle M ZimmermanPA-C 08/18/2017,8:13 AM   Nausea today, wait for d/c tomorrow  If bowels move and nausea improved  I have seen and examined [name] and agree with the above assessment  and plan.  Grace Isaac MD Beeper [phone] Office [phone] 08/18/2017 2:32 PM

## 2017-08-19 MED ORDER — LACTULOSE 10 GM/15ML PO SOLN
20.0000 g | Freq: Once | ORAL | Status: AC
  Administered 2017-08-19: 20 g via ORAL
  Filled 2017-08-19: qty 30

## 2017-08-19 MED ORDER — FLEET ENEMA 7-19 GM/118ML RE ENEM
1.0000 | ENEMA | Freq: Every day | RECTAL | Status: DC | PRN
  Administered 2017-08-19: 1 via RECTAL
  Filled 2017-08-19 (×2): qty 1

## 2017-08-19 NOTE — Progress Notes (Addendum)
      [address]       [place] [address]             [phone]       6 Days Post-Op Procedure(s) (LRB): VIDEO ASSISTED THORACOSCOPY (VATS)/ LOBECTOMY (Left)  Subjective: Patient  has not had a bowel movement yet, despite prune juice, miralax. She denies nausea this am.  Objective: Vital signs in last 24 hours: Temp:  [98.5 F (36.9 C)-99.1 F (37.3 C)] 98.5 F (36.9 C) (02/10 0807) Pulse Rate:  [62-86] 63 (02/10 0401) Cardiac Rhythm: Normal sinus rhythm (02/10 0807) Resp:  [14-26] 14 (02/10 0807) BP: (98-122)/(64-79) 122/78 (02/10 0807) SpO2:  [94 %-99 %] 94 % (02/10 0807)      Intake/Output from previous day: 02/09 0701 - 02/10 0700 In: 320 [P.O.:320] Out: -    Physical Exam:  Cardiovascular: RRR Pulmonary: Clear to auscultation to right and coarse on the left. Subcutaneous emphysema on back Abdomen: Soft, non tender, bowel sounds present. Extremities: No lower extremity edema. Wounds: Clean and dry.  No erythema or signs of infection.   Lab Results: CBC: No results for input(s): WBC, HGB, HCT, PLT in the last 72 hours. BMET:  No results for input(s): NA, K, CL, CO2, GLUCOSE, BUN, CREATININE, CALCIUM in the last 72 hours.  PT/INR: No results for input(s): LABPROT, INR in the last 72 hours. ABG:  INR: Will add last result for INR, ABG once components are confirmed Will add last 4 CBG results once components are confirmed  Assessment/Plan: 1. CV-Had brief a fib the evening of 02/07 and briefly once after. Mostly SR in the 70's. On Amiodarone 200 mg daily and Toprol 25 mg daily.  2. Pulmonary-On room air.  Encourage incentive spirometer.  3. Still no bowel movement-mag cit vs fleet enema vs lactulose. Patient to decide 4. Possible discharge later today if has bowel movemnt   Donielle M ZimmermanPA-C 08/19/2017,8:45 AM   Patient got bowels moving, wants to go home Given copy of path report  And explained to her  I have seen and  examined [name] and agree with the above assessment  and plan.  Grace Isaac MD Beeper [phone] Office [phone] 08/19/2017 12:21 PM

## 2017-08-19 NOTE — Progress Notes (Signed)
Patient discharged home with husband, prescriptions and paperwork packed with belongings. IVs were removed and patient was wheeled to car.

## 2017-08-21 ENCOUNTER — Encounter: Payer: Self-pay | Admitting: *Deleted

## 2017-08-21 NOTE — Progress Notes (Signed)
Oncology Nurse Navigator Documentation  Oncology Nurse Navigator Flowsheets 08/21/2017  Navigator Location [facility]  Navigator Encounter Type Other/I followed up with pathology dept regarding foundation one and PDL 1 being sent. Waiting for response.   Barriers/Navigation Needs Coordination of Care  Interventions Coordination of Care  Acuity Level 1  Time Spent with Patient 15

## 2017-08-23 ENCOUNTER — Other Ambulatory Visit: Payer: Self-pay | Admitting: *Deleted

## 2017-08-27 ENCOUNTER — Other Ambulatory Visit: Payer: Self-pay

## 2017-08-27 NOTE — Progress Notes (Unsigned)
This encounter was created in error - please disregard.

## 2017-08-28 ENCOUNTER — Other Ambulatory Visit: Payer: Self-pay

## 2017-08-28 DIAGNOSIS — G8918 Other acute postprocedural pain: Secondary | ICD-10-CM

## 2017-08-28 MED ORDER — OXYCODONE HCL 5 MG PO TABS
5.0000 mg | ORAL_TABLET | ORAL | 0 refills | Status: DC | PRN
Start: 2017-08-28 — End: 2017-09-04

## 2017-08-28 NOTE — Telephone Encounter (Signed)
Patient given 1st RX refill on Oxycodone 5 mg since hospital discharge.

## 2017-09-03 ENCOUNTER — Other Ambulatory Visit: Payer: Self-pay | Admitting: Thoracic Surgery (Cardiothoracic Vascular Surgery)

## 2017-09-03 DIAGNOSIS — C3492 Malignant neoplasm of unspecified part of left bronchus or lung: Secondary | ICD-10-CM

## 2017-09-04 ENCOUNTER — Ambulatory Visit (INDEPENDENT_AMBULATORY_CARE_PROVIDER_SITE_OTHER): Payer: Self-pay | Admitting: Thoracic Surgery (Cardiothoracic Vascular Surgery)

## 2017-09-04 ENCOUNTER — Ambulatory Visit
Admission: RE | Admit: 2017-09-04 | Discharge: 2017-09-04 | Disposition: A | Discharge status: Home / Self Care | Payer: Medicare HMO | Source: Ambulatory Visit | Attending: Thoracic Surgery (Cardiothoracic Vascular Surgery) | Admitting: Thoracic Surgery (Cardiothoracic Vascular Surgery)

## 2017-09-04 ENCOUNTER — Encounter: Payer: Self-pay | Admitting: Thoracic Surgery (Cardiothoracic Vascular Surgery)

## 2017-09-04 ENCOUNTER — Other Ambulatory Visit: Payer: Self-pay

## 2017-09-04 DIAGNOSIS — C3492 Malignant neoplasm of unspecified part of left bronchus or lung: Secondary | ICD-10-CM

## 2017-09-04 DIAGNOSIS — I480 Paroxysmal atrial fibrillation: Secondary | ICD-10-CM

## 2017-09-04 DIAGNOSIS — G8918 Other acute postprocedural pain: Secondary | ICD-10-CM

## 2017-09-04 MED ORDER — OXYCODONE HCL 5 MG PO TABS: 5.0000 mg | ORAL_TABLET | Freq: Four times a day (QID) | ORAL | 0 refills | Status: DC | PRN

## 2017-09-04 NOTE — Progress Notes (Signed)
[address]       [place] [address]             [phone]     HPI: Rachael Villanueva returns for a scheduled follow up visit  Nevelyn Mellott is a 67 yo woman with a history of tobacco abuse, COPD, hypothyroidism post ablation for Graves disease, and hyperparathyroidism s/p parathyroidectomy.  In January 2018 she had a CT angiogram to rule out a pulmonary embolus.  She had a 5.8 mm groundglass opacity in the left upper lobe.  Repeat CT was done in December which showed an increase in size to 8 mm.  On PET the nodule was hypermetabolic with an SUV of 2.0.  She underwent a left VATS segmentectomy on 08/13/2017.  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  There was one positive node.  She had stage IIa disease.  Postoperatively she had some transient atrial fibrillation.  She was started on metoprolol and amiodarone and was in sinus rhythm at discharge.  She still having a fair amount of pain and is taking oxycodone about 3 times a day.  She has been reluctant to go outside the house.  She complains of nausea particularly in the mornings.  Past Medical History:  Diagnosis Date  . Anxiety   . Depression   . Graves' disease   . Hypercalcemia   . Hypothyroidism    s/p post ablation for Graves Disease  . Lung nodule   . Sleep disorder   . Thyroid disease   . Vitamin D deficiency     Current Outpatient Medications  Medication Sig Dispense Refill  . ALPRAZolam (XANAX) 0.5 MG tablet Take 0.25-0.5 mg by mouth at bedtime as needed for anxiety or sleep.     . Armodafinil 250 MG tablet Take 250 mg by mouth daily.    . chlorhexidine (PERIDEX) 0.12 % solution Use as directed 15 mLs in the mouth or throat at bedtime.   98  . ergocalciferol (VITAMIN D2) 50000 units capsule Take 50,000 Units by mouth once a week.    . Homeopathic Products (SIMILASAN DRY EYE RELIEF OP) Apply 1 drop to eye daily as needed (dry eyes).    Marland Kitchen levothyroxine (SYNTHROID, LEVOTHROID) 112 MCG tablet Take  112 mcg by mouth daily before breakfast.    . metoprolol succinate (TOPROL-XL) 25 MG 24 hr tablet Take 1 tablet (25 mg total) by mouth daily. 30 tablet 1  . oxyCODONE (OXY IR/ROXICODONE) 5 MG immediate release tablet Take 1 tablet (5 mg total) by mouth every 6 (six) hours as needed for severe pain. 30 tablet 0  . traZODone (DESYREL) 50 MG tablet Take 50-100 mg by mouth at bedtime.   0  . vortioxetine HBr (TRINTELLIX) 20 MG TABS Take 20 mg by mouth every other day.      No current facility-administered medications for this visit.     Physical Exam BP 120/80 (BP Location: Left Arm, Patient Position: Sitting, Cuff Size: Normal)   Pulse (!) 59   Resp 18   Ht 4' 11.5" (1.511 m)   Wt 113 lb (51.3 kg)   SpO2 96% Comment: RA  BMI 22.73 kg/m  67 year old woman in no acute distress Alert and oriented x3 with no focal deficits Incisions healing well Lungs clear with equal breath sounds bilaterally  Diagnostic Tests: CHEST  2 VIEW  COMPARISON:  08/18/2017  FINDINGS: Postsurgical changes are noted on the left. There are pulmonary anastomosis staples crossing the mid  to upper left hilum with another staple line curving across the left upper lobe 2 medial left apex. There is associated linear perihilar density consistent with scarring. These findings are stable.  Remainder of the lungs is clear.  There is a small left pleural effusion.  No pneumothorax.  Cardiac silhouette is normal in size. No mediastinal or hilar masses. There is no evidence of adenopathy.  The subcutaneous emphysema noted on the left on the prior study has resolved.  Skeletal structures are intact.  IMPRESSION: 1. No acute cardiopulmonary disease. 2. Postsurgical changes on the left. Resolved subcutaneous emphysema.   Electronically Signed   By: Lajean Manes M.D.   On: 09/04/2017 09:20 I personally reviewed the chest x-ray images and concur with the findings noted above  Impression: Mrs.  Rachael Villanueva is a 67 year old woman with a history of tobacco abuse who had a left VATS segmentectomy for a small peripheral left upper lobe nodule.  She turned out to have stage IIa disease with involvement of one node.  Postoperatively she had atrial fibrillation which resolved with amiodarone and metoprolol.  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  She and her husband had a lot of questions about why she would need adjuvant chemotherapy and whether there are alternatives.  I recommended that she see Dr. Julien Nordmann our thoracic oncologist.  He can go over the indications and is also more familiar with the alternatives than I am.  There is no indication for adjuvant radiation  Postoperative atrial fibrillation-on amiodarone and metoprolol.  She complains of nausea.  The amiodarone certainly could be a factor in that.  I am going to discontinue that medication now.  Continue metoprolol for now.  Postoperative pain-she is using oxycodone about 3 times a day.  I gave her another prescription for an additional 30 tablets, no refills.  I advised her on some strategies to try to gradually wean off of that.  Had a lot of questions about activities.  At this point her activities are unrestricted although she should build into new activities gradually.  She may drive.   Plan: Referral to [facility] to see Dr. Julien Nordmann  Discontinue amiodarone  Return in 6 weeks with PA and lateral chest x-ray  Melrose Nakayama, MD [facility] [phone]

## 2017-09-05 ENCOUNTER — Telehealth: Payer: Self-pay | Admitting: *Deleted

## 2017-09-05 DIAGNOSIS — C3492 Malignant neoplasm of unspecified part of left bronchus or lung: Secondary | ICD-10-CM

## 2017-09-05 NOTE — Telephone Encounter (Signed)
Oncology Nurse Navigator Documentation  Oncology Nurse Navigator Flowsheets 09/05/2017  Navigator Location [facility]  Navigator Encounter Type Telephone/I received referral on Rachael Villanueva. I called her to arrange an appt to see Dr. Julien Nordmann. I was unable to reach but did leave vm message for her to call me with my name and phone number.   Telephone Outgoing Call  Treatment Phase Pre-Tx/Tx Discussion  Barriers/Navigation Needs Coordination of Care  Interventions Coordination of Care  Coordination of Care Other  Acuity Level 1  Time Spent with Patient 15

## 2017-09-05 NOTE — Telephone Encounter (Signed)
[name] called me back and then I called her. I spoke with patient. I gave her an appt to be seen on 09/11/17 with Dr. Julien Nordmann. She verbalized understanding of appt time and place.

## 2017-09-06 ENCOUNTER — Encounter (HOSPITAL_COMMUNITY): Payer: Self-pay

## 2017-09-10 DIAGNOSIS — E039 Hypothyroidism, unspecified: Secondary | ICD-10-CM | POA: Diagnosis not present

## 2017-09-11 ENCOUNTER — Inpatient Hospital Stay: Payer: Medicare HMO | Attending: Internal Medicine | Admitting: Internal Medicine

## 2017-09-11 ENCOUNTER — Encounter: Payer: Self-pay | Admitting: Internal Medicine

## 2017-09-11 ENCOUNTER — Encounter: Payer: Self-pay | Admitting: *Deleted

## 2017-09-11 ENCOUNTER — Inpatient Hospital Stay: Payer: Medicare HMO

## 2017-09-11 VITALS — BP 126/74 | HR 60 | Temp 97.6°F | Resp 18 | Ht 59.5 in | Wt 109.6 lb

## 2017-09-11 DIAGNOSIS — R1013 Epigastric pain: Secondary | ICD-10-CM | POA: Insufficient documentation

## 2017-09-11 DIAGNOSIS — Z801 Family history of malignant neoplasm of trachea, bronchus and lung: Secondary | ICD-10-CM

## 2017-09-11 DIAGNOSIS — C3492 Malignant neoplasm of unspecified part of left bronchus or lung: Secondary | ICD-10-CM

## 2017-09-11 DIAGNOSIS — E559 Vitamin D deficiency, unspecified: Secondary | ICD-10-CM | POA: Diagnosis not present

## 2017-09-11 DIAGNOSIS — Z87891 Personal history of nicotine dependence: Secondary | ICD-10-CM | POA: Diagnosis not present

## 2017-09-11 DIAGNOSIS — C3412 Malignant neoplasm of upper lobe, left bronchus or lung: Secondary | ICD-10-CM | POA: Diagnosis not present

## 2017-09-11 DIAGNOSIS — K219 Gastro-esophageal reflux disease without esophagitis: Secondary | ICD-10-CM | POA: Insufficient documentation

## 2017-09-11 DIAGNOSIS — Z800 Family history of malignant neoplasm of digestive organs: Secondary | ICD-10-CM

## 2017-09-11 DIAGNOSIS — I10 Essential (primary) hypertension: Secondary | ICD-10-CM | POA: Insufficient documentation

## 2017-09-11 DIAGNOSIS — E039 Hypothyroidism, unspecified: Secondary | ICD-10-CM | POA: Insufficient documentation

## 2017-09-11 DIAGNOSIS — C779 Secondary and unspecified malignant neoplasm of lymph node, unspecified: Secondary | ICD-10-CM

## 2017-09-11 LAB — CMP (CANCER CENTER ONLY)
ALBUMIN: 4.2 g/dL (ref 3.5–5.0)
ALT: 53 U/L (ref 0–55)
ANION GAP: 10 (ref 3–11)
AST: 23 U/L (ref 5–34)
Alkaline Phosphatase: 164 U/L — ABNORMAL HIGH (ref 40–150)
BUN: 18 mg/dL (ref 7–26)
CO2: 26 mmol/L (ref 22–29)
Calcium: 9.9 mg/dL (ref 8.4–10.4)
Chloride: 102 mmol/L (ref 98–109)
Creatinine: 0.98 mg/dL (ref 0.60–1.10)
GFR, Est AFR Am: >60 mL/min (ref >60)
GFR, Estimated: 58 mL/min — ABNORMAL LOW (ref >60)
GLUCOSE: 106 mg/dL (ref 70–140)
POTASSIUM: 4.6 mmol/L (ref 3.5–5.1)
SODIUM: 138 mmol/L (ref 136–145)
Total Bilirubin: 0.2 mg/dL (ref 0.2–1.2)
Total Protein: 7.3 g/dL (ref 6.4–8.3)

## 2017-09-11 LAB — CBC WITH DIFFERENTIAL (CANCER CENTER ONLY)
BASOS ABS: 0 10*3/uL (ref 0.0–0.1)
Basophils Relative: 1 %
EOS ABS: 0.1 10*3/uL (ref 0.0–0.5)
Eosinophils Relative: 2 %
HCT: 41.6 % (ref 34.8–46.6)
HEMOGLOBIN: 13.7 g/dL (ref 11.6–15.9)
LYMPHS ABS: 1.6 10*3/uL (ref 0.9–3.3)
LYMPHS PCT: 28 %
MCH: 30.3 pg (ref 25.1–34.0)
MCHC: 33 g/dL (ref 31.5–36.0)
MCV: 91.7 fL (ref 79.5–101.0)
Monocytes Absolute: 0.3 10*3/uL (ref 0.1–0.9)
Monocytes Relative: 5 %
NEUTROS PCT: 64 %
Neutro Abs: 3.7 10*3/uL (ref 1.5–6.5)
Platelet Count: 222 10*3/uL (ref 145–400)
RBC: 4.54 MIL/uL (ref 3.70–5.45)
RDW: 14.5 % (ref 11.2–14.5)
WBC Count: 5.7 10*3/uL (ref 3.9–10.3)

## 2017-09-11 NOTE — Progress Notes (Signed)
Oncology Nurse Navigator Documentation  Oncology Nurse Navigator Flowsheets 09/11/2017  Navigator Location [facility]  Navigator Encounter Type Clinic/MDC/I spoke with patient and husband at clinic today.  Rachael Villanueva was updated on treatment options. She would like a second opinion at [facility].  I will follow up with HIM dept and update them on referral to help expedite.   Abnormal Finding Date 06/26/2017  Confirmed Diagnosis Date 08/13/2017  Surgery Date 08/13/2017  Treatment Initiated Date 08/13/2017  Patient Visit Type MedOnc  Treatment Phase Pre-Tx/Tx Discussion  Barriers/Navigation Needs Education;Coordination of Care  Education Other  Interventions Coordination of Care;Education  Coordination of Care Other  Education Method Verbal  Acuity Level 2  Time Spent with Patient 75

## 2017-09-11 NOTE — Progress Notes (Signed)
[place] [phone]   [phone]  CONSULT NOTE  REFERRING PHYSICIAN: Dr. Modesto Charon  REASON FOR CONSULTATION:  67 years old white female recently diagnosed with lung cancer.  HPI Rachael Villanueva is a 67 y.o. female with past medical history significant for multiple medical problems including history of anxiety/depression, Graves' disease, hypothyroidism, sleep disorder, vitamin D deficiency as well as long history for smoking.  The patient was complaining of right shoulder pain and she was seen by her primary care physician and chest x-ray showed questionable nodule in the left upper lobe.  This was followed by CT scan of the chest without contrast on May 22, 2018 and that showed interval increase in the size of left upper lobe pulmonary nodule measuring 0.9 cm in diameter and appeared more solid and spiculated.  A PET scan was performed on June 26, 2017 and that showed mild hypermetabolism corresponding to a spiculated left upper lobe pulmonary nodule.  The nodule measured 0.8 x 0.8 cm with SUV max of 2.0.  There is no thoracic nodal hypermetabolism and no evidence of metastatic disease in the neck, abdomen or pelvis. The patient was referred to Dr. Roxan Hockey and on August 10, 2017 she underwent left VATS with wedge resection of the left upper lobe nodule as well as thoracic lingular segmentectomy and mediastinal lymph node sampling. The final pathology ([id number]) showed adenocarcinoma measuring 0.8 cm with negative margin.  There was evidence for metastatic adenocarcinoma in 1 lymph node at level 12 L.  There was no evidence for lymphovascular invasion or visceral pleural invasion.  Molecular studies were performed by [facility] 1 and showed no actionable mutations but it showed KRAS G12V.  PDL 1 expression was 5%. Dr. Roxan Hockey kindly referred the patient to me today for further evaluation and recommendation regarding adjuvant  therapy. When seen today the patient is feeling fine except for soreness on the left side of the chest from the surgical scar but no significant shortness of breath or hemoptysis.  She has mild cough.  She lost around 10 pounds in the last few months during her surgery. Family history significant for mother with lung cancer and died at age 49, father had parkinsonism, brother had thyroid disease, maternal grandfather had colon cancer. The patient is married and has no children.  She was accompanied by her husband Dellis Filbert.  She works as a Curator.  She has a history for smoking for around 40 years and quit August 12, 2017.  She is a recovering alcoholic and she has not drink alcohol recently.  She has no history of drug abuse.   HPI  Past Medical History:  Diagnosis Date  . Anxiety   . Depression   . Graves' disease   . Hypercalcemia   . Hypothyroidism    s/p post ablation for Graves Disease  . Lung nodule   . Sleep disorder   . Thyroid disease   . Vitamin D deficiency     Past Surgical History:  Procedure Laterality Date  . ABLATION     for graves dX  . COLONOSCOPY    . dental implant    . eye compression Bilateral    due to graves disease done at [facility]   . EYE SURGERY    . PARATHYROIDECTOMY    . VIDEO ASSISTED THORACOSCOPY (VATS)/ LOBECTOMY Left 08/13/2017   Procedure: VIDEO ASSISTED THORACOSCOPY (VATS)/ LOBECTOMY;  Surgeon: Melrose Nakayama, MD;  Location: [facility];  Service: Thoracic;  Laterality:  Left;    Family History  Problem Relation Age of Onset  . Thyroid disease Mother   . Lung cancer Mother   . Parkinson's disease Father   . Thyroid disease Brother     Social History Social History   Tobacco Use  . Smoking status: Former Smoker    Packs/day: 0.50    Types: Cigarettes    Last attempt to quit: 08/12/2017    Years since quitting: 0.0  . Smokeless tobacco: Never Used  . Tobacco comment: no more than 1/2 pack per day   Substance Use Topics  . Alcohol use:  No    Alcohol/week: 0.0 oz    Comment: Quit 1989  . Drug use: No    No Known Allergies  Current Outpatient Medications  Medication Sig Dispense Refill  . ALPRAZolam (XANAX) 0.5 MG tablet Take 0.25-0.5 mg by mouth at bedtime as needed for anxiety or sleep.     . Armodafinil 250 MG tablet Take 250 mg by mouth daily.    . chlorhexidine (PERIDEX) 0.12 % solution Use as directed 15 mLs in the mouth or throat at bedtime.   98  . ergocalciferol (VITAMIN D2) 50000 units capsule Take 50,000 Units by mouth once a week.    . Homeopathic Products (SIMILASAN DRY EYE RELIEF OP) Apply 1 drop to eye daily as needed (dry eyes).    Marland Kitchen levothyroxine (SYNTHROID, LEVOTHROID) 112 MCG tablet Take 112 mcg by mouth daily before breakfast.    . metoprolol succinate (TOPROL-XL) 25 MG 24 hr tablet Take 1 tablet (25 mg total) by mouth daily. 30 tablet 1  . oxyCODONE (OXY IR/ROXICODONE) 5 MG immediate release tablet Take 1 tablet (5 mg total) by mouth every 6 (six) hours as needed for severe pain. 30 tablet 0  . traZODone (DESYREL) 50 MG tablet Take 50-100 mg by mouth at bedtime.   0  . vortioxetine HBr (TRINTELLIX) 20 MG TABS Take 20 mg by mouth every other day.      No current facility-administered medications for this visit.     Review of Systems  Constitutional: positive for fatigue and weight loss Eyes: negative Ears, nose, mouth, throat, and face: negative Respiratory: positive for cough and pleurisy/chest pain Cardiovascular: negative Gastrointestinal: negative Genitourinary:negative Integument/breast: negative Hematologic/lymphatic: negative Musculoskeletal:negative Neurological: negative Behavioral/Psych: positive for anxiety and depression Endocrine: negative Allergic/Immunologic: negative  Physical Exam  VWP:VXYIA, healthy, no distress and anxious SKIN: skin color, texture, turgor are normal HEAD: Normocephalic, No masses, lesions, tenderness or abnormalities EYES: normal, PERRLA,  Conjunctiva are pink and non-injected EARS: External ears normal, Canals clear OROPHARYNX:no exudate, no erythema and lips, buccal mucosa, and tongue normal  NECK: supple, no adenopathy, no JVD LYMPH:  no palpable lymphadenopathy, no hepatosplenomegaly BREAST:not examined LUNGS: clear to auscultation , and palpation HEART: regular rate & rhythm, no murmurs and no gallops ABDOMEN:abdomen soft, non-tender, normal bowel sounds and no masses or organomegaly BACK: Back symmetric, no curvature., No CVA tenderness EXTREMITIES:no joint deformities, effusion, or inflammation, no edema, no skin discoloration  NEURO: alert & oriented x 3 with fluent speech, no focal motor/sensory deficits  PERFORMANCE STATUS: ECOG 1  LABORATORY DATA: Lab Results  Component Value Date   WBC 9.5 08/15/2017   HGB 11.6 (L) 08/15/2017   HCT 37.0 08/15/2017   MCV 94.1 08/15/2017   PLT 207 08/15/2017      Chemistry      Component Value Date/Time   NA 140 08/15/2017 0555   K 4.0 08/15/2017 0555  CL 104 08/15/2017 0555   CO2 26 08/15/2017 0555   BUN 8 08/15/2017 0555   CREATININE 0.67 08/15/2017 0555      Component Value Date/Time   CALCIUM 8.5 (L) 08/15/2017 0555   ALKPHOS 73 08/15/2017 0555   AST 21 08/15/2017 0555   ALT 16 08/15/2017 0555   BILITOT 0.6 08/15/2017 0555       RADIOGRAPHIC STUDIES: Dg Chest 2 View  Result Date: 09/04/2017 CLINICAL DATA:  Adenocarcinoma of the left lung, VATS / lobectomy 08/13/17, there are no current chest complaints EXAM: CHEST  2 VIEW COMPARISON:  08/18/2017 FINDINGS: Postsurgical changes are noted on the left. There are pulmonary anastomosis staples crossing the mid to upper left hilum with another staple line curving across the left upper lobe 2 medial left apex. There is associated linear perihilar density consistent with scarring. These findings are stable. Remainder of the lungs is clear. There is a small left pleural effusion. No pneumothorax. Cardiac silhouette is  normal in size. No mediastinal or hilar masses. There is no evidence of adenopathy. The subcutaneous emphysema noted on the left on the prior study has resolved. Skeletal structures are intact. IMPRESSION: 1. No acute cardiopulmonary disease. 2. Postsurgical changes on the left. Resolved subcutaneous emphysema. Electronically Signed   By: Lajean Manes M.D.   On: 09/04/2017 09:20   Dg Chest 2 View  Result Date: 08/18/2017 CLINICAL DATA:  Status post left upper lobe wedge resection. EXAM: CHEST  2 VIEW COMPARISON:  Chest x-ray from yesterday. FINDINGS: The heart size and mediastinal contours are within normal limits. Normal pulmonary vascularity. Postsurgical changes related to left upper lobe wedge resection again noted. No focal consolidation, pleural effusion, or pneumothorax. Unchanged subcutaneous emphysema in the left chest wall. IMPRESSION: Postsurgical changes.  No active cardiopulmonary disease. Electronically Signed   By: Titus Dubin M.D.   On: 08/18/2017 07:42   Dg Chest 2 View  Result Date: 08/13/2017 CLINICAL DATA:  Preoperative evaluation for upcoming lung nodule removal EXAM: CHEST  2 VIEW COMPARISON:  08/03/2016 FINDINGS: Cardiac shadow is within normal limits. Lungs are well aerated bilaterally. Bilateral nipple shadows are noted. Nodular density is noted in the left mid lung laterally corresponding to that seen on prior CT examination. No acute bony abnormality is noted. IMPRESSION: Stable left upper lobe nodule. Electronically Signed   By: Inez Catalina M.D.   On: 08/13/2017 10:26   Dg Chest 1v Repeat Same Day  Result Date: 08/17/2017 CLINICAL DATA:  Chest tube removal EXAM: CHEST - 1 VIEW SAME DAY COMPARISON:  08/17/2017 FINDINGS: Postoperative changes on the left. Interval chest tube removal. No pneumothorax. Subcutaneous emphysema in the left chest wall is stable. No confluent airspace opacities or effusions. Heart is normal size. IMPRESSION: Interval left chest tube removal without  pneumothorax. Electronically Signed   By: Rolm Baptise M.D.   On: 08/17/2017 11:23   Dg Chest Port 1 View  Result Date: 08/17/2017 CLINICAL DATA:  Follow-up pneumothorax EXAM: PORTABLE CHEST 1 VIEW COMPARISON:  08/16/2017 FINDINGS: Cardiac shadow is within normal limits. Postoperative changes are again seen. Right jugular central line has been removed in the interval. Left thoracostomy catheter is noted in satisfactory position. No pneumothorax is seen. No focal infiltrate is noted. IMPRESSION: No evidence of left pneumothorax. Electronically Signed   By: Inez Catalina M.D.   On: 08/17/2017 07:07   Dg Chest Port 1 View  Result Date: 08/16/2017 CLINICAL DATA:  Pneumothorax, chest tube EXAM: PORTABLE CHEST 1 VIEW COMPARISON:  08/15/2017 FINDINGS: Left chest tube remains in place. Postoperative changes on the left. The no visible pneumothorax. Subcutaneous emphysema in the left chest wall is stable. Right lung clear. Heart is normal size. Right central line is unchanged. IMPRESSION: Left chest tube remains in place without visible pneumothorax. Stable subcutaneous air in the left chest wall. Postoperative changes on the left. Electronically Signed   By: Rolm Baptise M.D.   On: 08/16/2017 09:13   Dg Chest Port 1 View  Result Date: 08/15/2017 CLINICAL DATA:  Follow-up pneumothorax EXAM: PORTABLE CHEST 1 VIEW COMPARISON:  08/14/2017 FINDINGS: Right jugular central line and left thoracostomy catheter are again identified. Significant postsurgical changes on the left are noted. Subcutaneous emphysema is again seen. No definitive pneumothorax is identified. The right lung remains clear. IMPRESSION: Postsurgical changes without definitive pneumothorax. Electronically Signed   By: Inez Catalina M.D.   On: 08/15/2017 09:51   Dg Chest Port 1 View  Result Date: 08/14/2017 CLINICAL DATA:  Follow-up left thoracotomy yesterday with small pneumothorax. EXAM: PORTABLE CHEST 1 VIEW COMPARISON:  Chest x-ray of August 14, 2017  FINDINGS: The right lung is well-expanded and clear. On the left there is mild volume loss. There is stable patchy left perihilar increased density. A tiny apical pneumothorax is observed between the posterior aspects of the second and third ribs. The tip of the chest tube overlies the posterior aspect of the third rib. A suture line extends from the medial aspect of the left apex to the left hilum. The heart and pulmonary vascularity are normal. There calcification in the wall of the aortic arch. The right internal jugular venous catheter tip projects over the distal third of the SVC. IMPRESSION: Less than 10% left apical pneumothorax. The left chest tube is in good position. Stable increased perihilar density on the left. Thoracic aortic atherosclerosis. Electronically Signed   By: David  Martinique M.D.   On: 08/14/2017 15:39   Dg Chest Port 1 View  Result Date: 08/14/2017 CLINICAL DATA:  Status post partial left lobectomy. EXAM: PORTABLE CHEST 1 VIEW COMPARISON:  Portable chest x-ray of August 13, 2017 FINDINGS: The left lung is well expanded. No definite pneumothorax is observed. The chest tube tip projects in the left apex. Surgical suture is visible in the mid and upper left hemithorax and in the left perihilar region. The S. left hilar soft tissues are mildly prominent. The right lung is well-expanded and clear. The heart and pulmonary vascularity are normal. There is calcification in the wall of the aortic arch. The right internal jugular venous catheter tip projects over the lower third of the SVC. IMPRESSION: Status post left thoracotomy. No definite pneumothorax visible today. The support tubes are in stable position. Thoracic aortic atherosclerosis. Electronically Signed   By: David  Martinique M.D.   On: 08/14/2017 07:03   Dg Chest Port 1 View  Result Date: 08/13/2017 CLINICAL DATA:  Status post VATS on the left EXAM: PORTABLE CHEST 1 VIEW COMPARISON:  08/13/2017 FINDINGS: Cardiac shadow is stable.  Aortic calcifications are again seen. Right jugular central line is noted in satisfactory position. Left-sided thoracostomy catheter is seen. Postsurgical changes are noted without evidence of pneumothorax previously seen nodule has been removed in the interval. No other focal abnormality is seen. IMPRESSION: Postsurgical changes as described. Electronically Signed   By: Inez Catalina M.D.   On: 08/13/2017 15:59    ASSESSMENT: This is a 67 years old white female recently diagnosed with a stage IIA (T1a, N1, M0) non-small cell  lung cancer, adenocarcinoma presented with right upper lobe small pulmonary nodules as well as metastatic adenocarcinoma to level 12 L status post wedge resection of the left upper lobe nodule with thoracic lingular segmentectomy and mediastinal lymph node sampling under the care of Dr. Roxan Hockey on August 13, 2017.  PLAN: I had a lengthy discussion with the patient and her husband today about her current disease of stage, prognosis and treatment options. I explained to the patient that the 5-year survival for patient with a stage IIA is around 50%.  I also explained to the patient that adjuvant systemic chemotherapy with 4 cycles of platinum based therapy would probably increase the absolute survival rate to around 60% in 5 years in 5 years (10% increase).  She also understands that one patient out of 10 that are treated with have the survival benefit. I recommended for the patient 4 cycles of adjuvant systemic chemotherapy with cisplatin 75 mg/M2 and Alimta 500 mg/M2 every 3 weeks.  She was also given the option of continuous observation and close monitoring. I discussed with the patient the adverse effect of this treatment including but not limited to mild alopecia, myelosuppression, nausea and vomiting, peripheral neuropathy, liver or renal dysfunction as well as hearing deficit and even death from complication of chemotherapy. The patient had molecular studies done by foundation  1 and it did not show any actionable mutation.  PDL 1 expression was 5%.  She is not a good candidate for any clinical trial that we have at the [facility] cancer center at this point. The patient had a lot of questions today and I answered them completely to her satisfaction and I used a digital board to explain to the patient her disease and treatment benefit. By the end of the discussion, the patient was also interested in seeking a second opinion for assurance and to help her to make the right decision.  I would refer her to [facility] center to see either Dr. Durenda Hurt or Dr. Sharlet Salina for evaluation of her condition. I did not arrange a follow-up appointment for the patient on telemetry hear from her regarding her decision. If she decided against adjuvant systemic chemotherapy, I will arrange for the patient to come back for follow-up visit in 3 months for evaluation with repeat CT scan of the chest for restaging of her disease. The patient may also need MRI of the brain to rule out brain metastases but this will be discussed with her before starting any systemic therapy. She was advised to call immediately if she has any concerning symptoms in the interval. The patient voices understanding of current disease status and treatment options and is in agreement with the current care plan.  All questions were answered. The patient knows to call the clinic with any problems, questions or concerns. We can certainly see the patient much sooner if necessary.  Thank you so much for allowing me to participate in the care of NIKALA WALSWORTH. I will continue to follow up the patient with you and assist in her care.  I spent 60 minutes counseling the patient face to face. The total time spent in the appointment was 85 minutes.  Disclaimer: This note was dictated with voice recognition software. Similar sounding words can inadvertently be transcribed and may not be corrected upon  review.   Eilleen Kempf September 11, 2017, 2:08 PM

## 2017-09-12 ENCOUNTER — Telehealth: Payer: Self-pay | Admitting: Internal Medicine

## 2017-09-12 NOTE — Telephone Encounter (Signed)
Faxed records to [place] at [facility] after review she will call pt with appt.

## 2017-09-14 ENCOUNTER — Encounter: Payer: Self-pay | Admitting: *Deleted

## 2017-09-14 NOTE — Progress Notes (Signed)
Oncology Nurse Navigator Documentation  Oncology Nurse Navigator Flowsheets 09/14/2017  Navigator Location [facility]  Navigator Encounter Type Other/patient has a referral to [place]. I called the HIM dept to check on status of referral to help expedite.   Barriers/Navigation Needs Coordination of Care  Interventions Coordination of Care  Coordination of Care Other  Acuity Level 1  Time Spent with Patient 15

## 2017-09-17 ENCOUNTER — Encounter: Payer: Self-pay | Admitting: *Deleted

## 2017-09-17 NOTE — Progress Notes (Signed)
Oncology Nurse Navigator Documentation  Oncology Nurse Navigator Flowsheets 09/17/2017  Navigator Location [facility]  Navigator Encounter Type Other/I received an update from HIM dept. Patient referral to [facility] was completed on 09/12/17.  [facility] will call with an appt for patient.   Barriers/Navigation Needs Coordination of Care  Interventions Coordination of Care  Coordination of Care Other  Acuity Level 1  Time Spent with Patient 15

## 2017-09-18 DIAGNOSIS — C3412 Malignant neoplasm of upper lobe, left bronchus or lung: Secondary | ICD-10-CM | POA: Diagnosis not present

## 2017-09-21 ENCOUNTER — Telehealth: Payer: Self-pay

## 2017-09-21 NOTE — Telephone Encounter (Signed)
Rachael Villanueva called to state that she is no longer taking metoprolol.  She had a VATS/ lobectomy 08/13/2017 to which she said she was discharged on the medication because she had an irregular heartbeat. She stated that when she takes it, it makes her sick and fatigued. I advised her to continue to take the medication until she could be evaluated by a physician/ her PCP, but she refused.  Dr. Roxan Hockey is aware.

## 2017-09-24 ENCOUNTER — Telehealth: Payer: Self-pay | Admitting: Medical Oncology

## 2017-09-24 NOTE — Telephone Encounter (Signed)
If she declined therapy, I will see her back for follow-up visit in 3 months with repeat CT scan of the chest and lab work.

## 2017-09-24 NOTE — Telephone Encounter (Signed)
Dr Sharlet Salina recommended same tx as Rachael Villanueva. He said pt declined therapy but wants f/u. No appts scheduled.

## 2017-09-25 ENCOUNTER — Other Ambulatory Visit: Payer: Self-pay | Admitting: Medical Oncology

## 2017-09-25 ENCOUNTER — Telehealth: Payer: Self-pay | Admitting: Oncology

## 2017-09-25 DIAGNOSIS — C3492 Malignant neoplasm of unspecified part of left bronchus or lung: Secondary | ICD-10-CM

## 2017-09-25 NOTE — Telephone Encounter (Signed)
09/25/17 1:43 PM 09/25/17 Sched. IB message rc'd from Abelina Bachelor, RN to schedule pt 3 months out for lab + MD.  In contacting patient, Rachael Villanueva states she will not agree to that until she has an opportunity to speak with Dr. Julien Nordmann to understand her next steps and plan of care.  The patient had many questions and requested a face:face visit with Dr. Julien Nordmann to review her questions after being sent for a second opinion and now being asked to follow-up in 3 months.  I made an appt for her to see Dr. Julien Nordmann on 3/25 to review all of her questions and concerns:  1) what is the follow-up CT for and does she need contrast 2) what lab work is being performed and why does she need it 3) did Dr. Julien Nordmann and the second opinion MD speak - what are her next steps, as she does not want chemotherapy 4) specific request to see Dr. Julien Nordmann before waiting 3 months, since she has not seen him again since the initial consult.

## 2017-10-01 ENCOUNTER — Inpatient Hospital Stay (HOSPITAL_BASED_OUTPATIENT_CLINIC_OR_DEPARTMENT_OTHER): Payer: Medicare HMO | Admitting: Internal Medicine

## 2017-10-01 ENCOUNTER — Telehealth: Payer: Self-pay | Admitting: Internal Medicine

## 2017-10-01 ENCOUNTER — Encounter: Payer: Self-pay | Admitting: Internal Medicine

## 2017-10-01 VITALS — BP 149/64 | HR 104 | Temp 97.7°F | Resp 20 | Ht 59.0 in | Wt 110.0 lb

## 2017-10-01 DIAGNOSIS — I10 Essential (primary) hypertension: Secondary | ICD-10-CM | POA: Diagnosis not present

## 2017-10-01 DIAGNOSIS — K219 Gastro-esophageal reflux disease without esophagitis: Secondary | ICD-10-CM | POA: Diagnosis not present

## 2017-10-01 DIAGNOSIS — I480 Paroxysmal atrial fibrillation: Secondary | ICD-10-CM

## 2017-10-01 DIAGNOSIS — E039 Hypothyroidism, unspecified: Secondary | ICD-10-CM

## 2017-10-01 DIAGNOSIS — C3492 Malignant neoplasm of unspecified part of left bronchus or lung: Secondary | ICD-10-CM

## 2017-10-01 DIAGNOSIS — C3490 Malignant neoplasm of unspecified part of unspecified bronchus or lung: Secondary | ICD-10-CM

## 2017-10-01 DIAGNOSIS — R1013 Epigastric pain: Secondary | ICD-10-CM | POA: Diagnosis not present

## 2017-10-01 DIAGNOSIS — C3412 Malignant neoplasm of upper lobe, left bronchus or lung: Secondary | ICD-10-CM

## 2017-10-01 DIAGNOSIS — F411 Generalized anxiety disorder: Secondary | ICD-10-CM

## 2017-10-01 NOTE — Progress Notes (Signed)
[place] Telephone:(336) [phone]   Fax:(336) [phone]  OFFICE PROGRESS NOTE  Glenford Bayley, DO [address] [place] [address]  DIAGNOSIS: Stage IIA (T1a, N1, M0) non-small cell lung cancer, adenocarcinoma presented with right upper lobe small pulmonary nodules as well as metastatic adenocarcinoma to level 12L, diagnosed in February 2019.  Biomarker Findings Microsatellite status - Cannot Be Determined Tumor Mutational Burden - Cannot Be Determined Genomic Findings For a complete list of the genes assayed, please refer to the Appendix. KRAS G12V 7 Disease relevant genes with no reportable alterations: EGFR, ALK, BRAF, MET, RET, ERBB2, ROS1  PDL1 expression 5%.  PRIOR THERAPY: Status post wedge resection of the left upper lobe nodule with thoracic lingular segmentectomy and mediastinal lymph node sampling under the care of Dr. Roxan Hockey on August 13, 2017.  CURRENT THERAPY: Observation.  INTERVAL HISTORY: Rachael Villanueva 67 y.o. female returns to the clinic today for follow-up visit accompanied by her husband.  The patient is feeling fine today with no specific complaints except for indigestion that she mentioned it started after her surgery.  She denied having any current chest pain, shortness of breath, cough or hemoptysis.  She denied having any nausea, vomiting, diarrhea or constipation.  She has no fever or chills.  She denied having any significant weight loss or night sweats.  She has no headache or visual changes.  She was seen recently at [facility] by Dr. Sharlet Salina and he recommended for her either adjuvant chemotherapy or observation.  The patient is here today for reevaluation and discussion of her options.  MEDICAL HISTORY: Past Medical History:  Diagnosis Date  . Anxiety   . Depression   . Graves' disease   . Hypercalcemia   . Hypothyroidism    s/p post ablation for Graves Disease  . Lung nodule   . Sleep disorder   .  Thyroid disease   . Vitamin D deficiency     ALLERGIES:  has No Known Allergies.  MEDICATIONS:  Current Outpatient Medications  Medication Sig Dispense Refill  . ALPRAZolam (XANAX) 0.5 MG tablet Take 0.25-0.5 mg by mouth at bedtime as needed for anxiety or sleep.     . Armodafinil 250 MG tablet Take 250 mg by mouth daily.    . bisacodyl (DULCOLAX) 5 MG EC tablet Take 5 mg by mouth daily as needed for moderate constipation.    . chlorhexidine (PERIDEX) 0.12 % solution Use as directed 15 mLs in the mouth or throat at bedtime.   98  . ergocalciferol (VITAMIN D2) 50000 units capsule Take 50,000 Units by mouth once a week.    . Homeopathic Products (SIMILASAN DRY EYE RELIEF OP) Apply 1 drop to eye daily as needed (dry eyes).    Marland Kitchen levothyroxine (SYNTHROID, LEVOTHROID) 112 MCG tablet Take 112 mcg by mouth daily before breakfast.    . metoprolol succinate (TOPROL-XL) 25 MG 24 hr tablet Take 1 tablet (25 mg total) by mouth daily. 30 tablet 1  . oseltamivir (TAMIFLU) 75 MG capsule     . oxyCODONE (OXY IR/ROXICODONE) 5 MG immediate release tablet Take 1 tablet (5 mg total) by mouth every 6 (six) hours as needed for severe pain. 30 tablet 0  . traZODone (DESYREL) 50 MG tablet Take 50-100 mg by mouth at bedtime.   0  . vortioxetine HBr (TRINTELLIX) 20 MG TABS Take 20 mg by mouth every other day.      No current  facility-administered medications for this visit.     SURGICAL HISTORY:  Past Surgical History:  Procedure Laterality Date  . ABLATION     for graves dX  . COLONOSCOPY    . dental implant    . eye compression Bilateral    due to graves disease done at [facility]   . EYE SURGERY    . PARATHYROIDECTOMY    . VIDEO ASSISTED THORACOSCOPY (VATS)/ LOBECTOMY Left 08/13/2017   Procedure: VIDEO ASSISTED THORACOSCOPY (VATS)/ LOBECTOMY;  Surgeon: Melrose Nakayama, MD;  Location: [facility];  Service: Thoracic;  Laterality: Left;    REVIEW OF SYSTEMS:  Constitutional: negative Eyes: negative Ears,  nose, mouth, throat, and face: negative Respiratory: negative Cardiovascular: negative Gastrointestinal: positive for dyspepsia and reflux symptoms Genitourinary:negative Integument/breast: negative Hematologic/lymphatic: negative Musculoskeletal:negative Neurological: negative Behavioral/Psych: negative Endocrine: negative Allergic/Immunologic: negative   PHYSICAL EXAMINATION: General appearance: alert, cooperative and no distress Head: Normocephalic, without obvious abnormality, atraumatic Neck: no adenopathy, no JVD, supple, symmetrical, trachea midline and thyroid not enlarged, symmetric, no tenderness/mass/nodules Lymph nodes: Cervical, supraclavicular, and axillary nodes normal. Resp: clear to auscultation bilaterally Back: symmetric, no curvature. ROM normal. No CVA tenderness. Cardio: regular rate and rhythm, S1, S2 normal, no murmur, click, rub or gallop GI: soft, non-tender; bowel sounds normal; no masses,  no organomegaly Extremities: extremities normal, atraumatic, no cyanosis or edema Neurologic: Alert and oriented X 3, normal strength and tone. Normal symmetric reflexes. Normal coordination and gait  ECOG PERFORMANCE STATUS: 1 - Symptomatic but completely ambulatory  Blood pressure (!) 149/64, pulse (!) 104, temperature 97.7 F (36.5 C), temperature source Oral, resp. rate 20, height '4\' 11"'  (1.499 m), weight 110 lb (49.9 kg), SpO2 100 %.  LABORATORY DATA: Lab Results  Component Value Date   WBC 5.7 09/11/2017   HGB 11.6 (L) 08/15/2017   HCT 41.6 09/11/2017   MCV 91.7 09/11/2017   PLT 222 09/11/2017      Chemistry      Component Value Date/Time   NA 138 09/11/2017 1110   K 4.6 09/11/2017 1110   CL 102 09/11/2017 1110   CO2 26 09/11/2017 1110   BUN 18 09/11/2017 1110   CREATININE 0.98 09/11/2017 1110      Component Value Date/Time   CALCIUM 9.9 09/11/2017 1110   ALKPHOS 164 (H) 09/11/2017 1110   AST 23 09/11/2017 1110   ALT 53 09/11/2017 1110    BILITOT 0.2 09/11/2017 1110       RADIOGRAPHIC STUDIES: Dg Chest 2 View  Result Date: 09/04/2017 CLINICAL DATA:  Adenocarcinoma of the left lung, VATS / lobectomy 08/13/17, there are no current chest complaints EXAM: CHEST  2 VIEW COMPARISON:  08/18/2017 FINDINGS: Postsurgical changes are noted on the left. There are pulmonary anastomosis staples crossing the mid to upper left hilum with another staple line curving across the left upper lobe 2 medial left apex. There is associated linear perihilar density consistent with scarring. These findings are stable. Remainder of the lungs is clear. There is a small left pleural effusion. No pneumothorax. Cardiac silhouette is normal in size. No mediastinal or hilar masses. There is no evidence of adenopathy. The subcutaneous emphysema noted on the left on the prior study has resolved. Skeletal structures are intact. IMPRESSION: 1. No acute cardiopulmonary disease. 2. Postsurgical changes on the left. Resolved subcutaneous emphysema. Electronically Signed   By: Lajean Manes M.D.   On: 09/04/2017 09:20    ASSESSMENT AND PLAN: This is a 67 years old white female recently diagnosed with  a stage IIA (T1a, N1, M0) non-small cell lung cancer, adenocarcinoma with no actionable mutations.  She is status post wedge resection of the left upper lobe nodule with lingular segmentectomy and mediastinal lymph node sampling under the care of Dr. Roxan Hockey on August 13, 2017. I had a lengthy discussion with the patient and her husband today about her condition.  She was offered adjuvant systemic chemotherapy versus observation and close monitoring.  The patient has no interest in adjuvant systemic chemotherapy and she would like to continue on observation. I will arrange for her to come back for follow-up visit in 4 months for reevaluation after repeating CT scan of the chest for restaging of her disease. For hypertension, she will continue her current treatment with  metoprolol. For hypothyroidism she is currently on levothyroxine. For the dyspepsia and acid reflux, I offered the patient referral to gastroenterology but she would like to hold on this option for now. She was advised to call immediately if she has any concerning symptoms in the interval. The patient voices understanding of current disease status and treatment options and is in agreement with the current care plan.  All questions were answered. The patient knows to call the clinic with any problems, questions or concerns. We can certainly see the patient much sooner if necessary.  Disclaimer: This note was dictated with voice recognition software. Similar sounding words can inadvertently be transcribed and may not be corrected upon review.

## 2017-10-01 NOTE — Telephone Encounter (Signed)
Scheduled appt per 3/25 los - Gave patient AVS and calender per los.

## 2017-10-15 ENCOUNTER — Other Ambulatory Visit: Payer: Self-pay | Admitting: Thoracic Surgery (Cardiothoracic Vascular Surgery)

## 2017-10-15 DIAGNOSIS — C3490 Malignant neoplasm of unspecified part of unspecified bronchus or lung: Secondary | ICD-10-CM

## 2017-10-16 ENCOUNTER — Ambulatory Visit
Admission: RE | Admit: 2017-10-16 | Discharge: 2017-10-16 | Disposition: A | Discharge status: Home / Self Care | Payer: Medicare HMO | Source: Ambulatory Visit | Attending: Thoracic Surgery (Cardiothoracic Vascular Surgery) | Admitting: Thoracic Surgery (Cardiothoracic Vascular Surgery)

## 2017-10-16 ENCOUNTER — Ambulatory Visit (INDEPENDENT_AMBULATORY_CARE_PROVIDER_SITE_OTHER): Payer: Self-pay | Admitting: Thoracic Surgery (Cardiothoracic Vascular Surgery)

## 2017-10-16 ENCOUNTER — Other Ambulatory Visit: Payer: Self-pay

## 2017-10-16 ENCOUNTER — Encounter: Payer: Self-pay | Admitting: Thoracic Surgery (Cardiothoracic Vascular Surgery)

## 2017-10-16 VITALS — BP 124/82 | HR 78 | Resp 16 | Ht 59.0 in | Wt 112.0 lb

## 2017-10-16 DIAGNOSIS — C3490 Malignant neoplasm of unspecified part of unspecified bronchus or lung: Secondary | ICD-10-CM

## 2017-10-16 DIAGNOSIS — C3492 Malignant neoplasm of unspecified part of left bronchus or lung: Secondary | ICD-10-CM

## 2017-10-16 DIAGNOSIS — R05 Cough: Secondary | ICD-10-CM | POA: Diagnosis not present

## 2017-10-16 DIAGNOSIS — C3412 Malignant neoplasm of upper lobe, left bronchus or lung: Secondary | ICD-10-CM

## 2017-10-16 DIAGNOSIS — Z09 Encounter for follow-up examination after completed treatment for conditions other than malignant neoplasm: Secondary | ICD-10-CM

## 2017-10-16 NOTE — Progress Notes (Signed)
[address]       ,[place] [address]             [phone]     HPI: Rachael Villanueva returns for a scheduled follow-up visit  She is a 67 year old woman who had a thoracoscopic lingular segmentectomy and node dissection on 08/13/2017.  Postoperatively she had atrial fibrillation, but converted to sinus rhythm with metoprolol and amiodarone.  She ended up having a T1, N1 stage IIa adenocarcinoma.  I saw her on February 26 and she was still having a fair amount of pain at that point.  She also was having a lot of nausea.  She saw Dr. Julien Nordmann in consultation and also got a second opinion from Dr. Sharlet Salina at [facility].  She decided not to have chemotherapy.  She feels well.  She is not having any incisional pain.  She feels much less anxiety now that she is decided not to have chemotherapy.  Over the past week or so she is noted a dry cough which she thinks is due to allergies.  Past Medical History:  Diagnosis Date  . Anxiety   . Depression   . Graves' disease   . Hypercalcemia   . Hypothyroidism    s/p post ablation for Graves Disease  . Lung nodule   . Sleep disorder   . Thyroid disease   . Vitamin D deficiency      Current Outpatient Medications  Medication Sig Dispense Refill  . ALPRAZolam (XANAX) 0.5 MG tablet Take 0.25-0.5 mg by mouth at bedtime as needed for anxiety or sleep.     . Armodafinil 250 MG tablet Take 250 mg by mouth daily.    . chlorhexidine (PERIDEX) 0.12 % solution Use as directed 15 mLs in the mouth or throat at bedtime.   98  . ergocalciferol (VITAMIN D2) 50000 units capsule Take 50,000 Units by mouth once a week.    . Homeopathic Products (SIMILASAN DRY EYE RELIEF OP) Apply 1 drop to eye daily as needed (dry eyes).    Marland Kitchen levothyroxine (SYNTHROID, LEVOTHROID) 175 MCG tablet Take 175 mcg by mouth daily before breakfast.    . traZODone (DESYREL) 50 MG tablet Take 50-100 mg by mouth at bedtime.   0  . vortioxetine HBr (TRINTELLIX) 20 MG TABS  Take 20 mg by mouth every other day.      No current facility-administered medications for this visit.     Physical Exam BP 124/82 (BP Location: Right Arm, Patient Position: Sitting, Cuff Size: Normal)   Pulse 78   Resp 16   Ht 4\' 11"  (1.499 m)   Wt 112 lb (50.8 kg)   SpO2 98% Comment: ON RA  BMI 22.44 kg/m  67 year old woman in no acute distress Alert and oriented x3 with no focal deficits Lungs clear with equal breath sounds bilaterally Incisions well-healed Cardiac regular rate and rhythm normal S1 and S2  Diagnostic Tests: CHEST - 2 VIEW  COMPARISON:  09/04/2017  FINDINGS: Cardiac shadow is within normal limits. Postsurgical changes are noted on the left consistent with the patient's given clinical history. Mild scarring is noted. No focal infiltrate or sizable effusion is seen. No acute bony abnormality is noted.  IMPRESSION: Postsurgical changes on the left without acute abnormality.   Electronically Signed   By: Inez Catalina M.D.   On: 10/16/2017 09:45 I personally reviewed the chest x-ray and concur with the findings noted above  Impression: Rachael Villanueva is a 67 year old woman  with a history of tobacco abuse who had a thoracoscopic lingular segmentectomy and node dissection on 08/13/2017.  She turned out to have a stage IIA, T1, N1 adenocarcinoma.  She is doing extremely well at this point in time.  She is not having any respiratory issues.  She does not have any pain.  Postoperative atrial fibrillation-converted back to sinus rhythm with amiodarone and Toprol.  We discontinued her amiodarone about a month ago.  She is in a normal rhythm today.  She wants to stop the Toprol.  She is already decreased the dose to 12.5 mg daily.  She is 8 weeks out from surgery now so I think it safe to stop the metoprolol.  She is aware of potential symptoms from atrial fibrillation and will get checked if she experiences any of those.  Plan: Follow-up with Dr. Julien Nordmann as  scheduled.  I will be happy to see her back anytime the future if I can be of any further assistance with her care.  Melrose Nakayama, MD [facility] [phone]

## 2017-10-17 DIAGNOSIS — H524 Presbyopia: Secondary | ICD-10-CM | POA: Diagnosis not present

## 2017-11-14 DIAGNOSIS — E039 Hypothyroidism, unspecified: Secondary | ICD-10-CM | POA: Diagnosis not present

## 2017-12-14 ENCOUNTER — Other Ambulatory Visit: Payer: Medicare HMO

## 2017-12-28 ENCOUNTER — Telehealth: Payer: Self-pay | Admitting: *Deleted

## 2017-12-28 NOTE — Telephone Encounter (Signed)
Faxed ROI to [employer]; release [phone]

## 2018-01-02 ENCOUNTER — Telehealth: Payer: Self-pay | Admitting: Medical Oncology

## 2018-01-02 NOTE — Telephone Encounter (Signed)
Pt calling about CT scan appt. Not yet scheduled for July.

## 2018-01-03 DIAGNOSIS — Z9109 Other allergy status, other than to drugs and biological substances: Secondary | ICD-10-CM | POA: Diagnosis not present

## 2018-01-03 DIAGNOSIS — R0989 Other specified symptoms and signs involving the circulatory and respiratory systems: Secondary | ICD-10-CM | POA: Diagnosis not present

## 2018-01-03 DIAGNOSIS — J302 Other seasonal allergic rhinitis: Secondary | ICD-10-CM | POA: Diagnosis not present

## 2018-01-03 DIAGNOSIS — Z1211 Encounter for screening for malignant neoplasm of colon: Secondary | ICD-10-CM | POA: Diagnosis not present

## 2018-01-03 DIAGNOSIS — R5383 Other fatigue: Secondary | ICD-10-CM | POA: Diagnosis not present

## 2018-01-03 DIAGNOSIS — E039 Hypothyroidism, unspecified: Secondary | ICD-10-CM | POA: Diagnosis not present

## 2018-01-28 ENCOUNTER — Ambulatory Visit (HOSPITAL_COMMUNITY)
Admission: RE | Admit: 2018-01-28 | Discharge: 2018-01-28 | Disposition: A | Discharge status: Home / Self Care | Payer: Medicare HMO | Source: Ambulatory Visit | Attending: Internal Medicine | Admitting: Internal Medicine

## 2018-01-28 ENCOUNTER — Inpatient Hospital Stay: Payer: Medicare HMO | Attending: Internal Medicine

## 2018-01-28 ENCOUNTER — Encounter (HOSPITAL_COMMUNITY): Payer: Self-pay

## 2018-01-28 DIAGNOSIS — K449 Diaphragmatic hernia without obstruction or gangrene: Secondary | ICD-10-CM | POA: Insufficient documentation

## 2018-01-28 DIAGNOSIS — C3490 Malignant neoplasm of unspecified part of unspecified bronchus or lung: Secondary | ICD-10-CM | POA: Diagnosis present

## 2018-01-28 DIAGNOSIS — Z79899 Other long term (current) drug therapy: Secondary | ICD-10-CM | POA: Diagnosis not present

## 2018-01-28 DIAGNOSIS — J439 Emphysema, unspecified: Secondary | ICD-10-CM | POA: Insufficient documentation

## 2018-01-28 DIAGNOSIS — I700 Atherosclerosis of aorta: Secondary | ICD-10-CM | POA: Insufficient documentation

## 2018-01-28 DIAGNOSIS — Z85118 Personal history of other malignant neoplasm of bronchus and lung: Secondary | ICD-10-CM | POA: Insufficient documentation

## 2018-01-28 LAB — CBC WITH DIFFERENTIAL (CANCER CENTER ONLY)
BASOS PCT: 1 %
Basophils Absolute: 0 10*3/uL (ref 0.0–0.1)
Eosinophils Absolute: 0.2 10*3/uL (ref 0.0–0.5)
Eosinophils Relative: 4 %
HEMATOCRIT: 38.9 % (ref 34.8–46.6)
HEMOGLOBIN: 12.9 g/dL (ref 11.6–15.9)
Lymphocytes Relative: 38 %
Lymphs Abs: 1.9 10*3/uL (ref 0.9–3.3)
MCH: 29.9 pg (ref 25.1–34.0)
MCHC: 33.2 g/dL (ref 31.5–36.0)
MCV: 89.9 fL (ref 79.5–101.0)
MONO ABS: 0.3 10*3/uL (ref 0.1–0.9)
MONOS PCT: 6 %
NEUTROS ABS: 2.6 10*3/uL (ref 1.5–6.5)
Neutrophils Relative %: 51 %
Platelet Count: 238 10*3/uL (ref 145–400)
RBC: 4.33 MIL/uL (ref 3.70–5.45)
RDW: 13.8 % (ref 11.2–14.5)
WBC Count: 5 10*3/uL (ref 3.9–10.3)

## 2018-01-28 LAB — CMP (CANCER CENTER ONLY)
ALBUMIN: 4.1 g/dL (ref 3.5–5.0)
ALK PHOS: 136 U/L — AB (ref 38–126)
ALT: 15 U/L (ref 0–44)
ANION GAP: 9 (ref 5–15)
AST: 15 U/L (ref 15–41)
BILIRUBIN TOTAL: 0.2 mg/dL — AB (ref 0.3–1.2)
BUN: 12 mg/dL (ref 8–23)
CALCIUM: 9.4 mg/dL (ref 8.9–10.3)
CO2: 28 mmol/L (ref 22–32)
CREATININE: 0.74 mg/dL (ref 0.44–1.00)
Chloride: 103 mmol/L (ref 98–111)
GFR, Est AFR Am: >60 mL/min (ref >60)
GFR, Estimated: >60 mL/min (ref >60)
Glucose, Bld: 104 mg/dL — ABNORMAL HIGH (ref 70–99)
Potassium: 4.2 mmol/L (ref 3.5–5.1)
SODIUM: 140 mmol/L (ref 135–145)
Total Protein: 6.9 g/dL (ref 6.5–8.1)

## 2018-01-28 MED ORDER — IOPAMIDOL (ISOVUE-300) INJECTION 61%
75.0000 mL | Freq: Once | INTRAVENOUS | Status: AC | PRN
  Administered 2018-01-28: 75 mL via INTRAVENOUS

## 2018-01-28 MED ORDER — IOPAMIDOL (ISOVUE-370) INJECTION 76%: 75.0000 mL | Freq: Once | INTRAVENOUS | Status: DC | PRN

## 2018-01-29 DIAGNOSIS — E039 Hypothyroidism, unspecified: Secondary | ICD-10-CM | POA: Diagnosis not present

## 2018-01-29 DIAGNOSIS — R69 Illness, unspecified: Secondary | ICD-10-CM | POA: Diagnosis not present

## 2018-01-30 ENCOUNTER — Inpatient Hospital Stay (HOSPITAL_BASED_OUTPATIENT_CLINIC_OR_DEPARTMENT_OTHER): Payer: Medicare HMO | Admitting: Internal Medicine

## 2018-01-30 ENCOUNTER — Telehealth: Payer: Self-pay | Admitting: Internal Medicine

## 2018-01-30 ENCOUNTER — Encounter: Payer: Self-pay | Admitting: Internal Medicine

## 2018-01-30 DIAGNOSIS — C3490 Malignant neoplasm of unspecified part of unspecified bronchus or lung: Secondary | ICD-10-CM

## 2018-01-30 DIAGNOSIS — Z79899 Other long term (current) drug therapy: Secondary | ICD-10-CM | POA: Diagnosis not present

## 2018-01-30 DIAGNOSIS — Z85118 Personal history of other malignant neoplasm of bronchus and lung: Secondary | ICD-10-CM

## 2018-01-30 NOTE — Telephone Encounter (Signed)
Gave patient avs and calendar of upcoming jan appts.

## 2018-01-30 NOTE — Progress Notes (Signed)
[place] Telephone:(336) [phone]   Fax:(336) [phone]  OFFICE PROGRESS NOTE  Glenford Bayley, DO [address] [place] [address]  DIAGNOSIS: Stage IIA (T1a, N1, M0) non-small cell lung cancer, adenocarcinoma presented with right upper lobe small pulmonary nodules as well as metastatic adenocarcinoma to level 12L, diagnosed in February 2019.  Biomarker Findings Microsatellite status - Cannot Be Determined Tumor Mutational Burden - Cannot Be Determined Genomic Findings For a complete list of the genes assayed, please refer to the Appendix. KRAS G12V 7 Disease relevant genes with no reportable alterations: EGFR, ALK, BRAF, MET, RET, ERBB2, ROS1  PDL1 expression 5%.  PRIOR THERAPY: Status post wedge resection of the left upper lobe nodule with thoracic lingular segmentectomy and mediastinal lymph node sampling under the care of Dr. Roxan Hockey on August 13, 2017.  CURRENT THERAPY: Observation.  INTERVAL HISTORY: Rachael Villanueva 67 y.o. female returns to the clinic today for 4 months follow-up visit accompanied by her husband.  The patient has no complaints today.  She denied having any chest pain, shortness of breath, cough or hemoptysis.  She denied having any fatigue or weakness.  She has no significant weight loss or night sweats.  She has no nausea, vomiting, diarrhea or constipation.  She is here today for evaluation with repeat CT scan of the chest for restaging of her disease.  MEDICAL HISTORY: Past Medical History:  Diagnosis Date  . Anxiety   . Depression   . Graves' disease   . Hypercalcemia   . Hypothyroidism    s/p post ablation for Graves Disease  . Lung nodule   . Sleep disorder   . Thyroid disease   . Vitamin D deficiency     ALLERGIES:  has No Known Allergies.  MEDICATIONS:  Current Outpatient Medications  Medication Sig Dispense Refill  . ALPRAZolam (XANAX) 0.5 MG tablet Take 0.25-0.5 mg by mouth at bedtime as needed for anxiety or  sleep.     . Armodafinil 250 MG tablet Take 250 mg by mouth daily.    . chlorhexidine (PERIDEX) 0.12 % solution Use as directed 15 mLs in the mouth or throat at bedtime.   98  . ergocalciferol (VITAMIN D2) 50000 units capsule Take 50,000 Units by mouth once a week.    . Homeopathic Products (SIMILASAN DRY EYE RELIEF OP) Apply 1 drop to eye daily as needed (dry eyes).    Marland Kitchen levothyroxine (SYNTHROID, LEVOTHROID) 137 MCG tablet Take 1 tablet by mouth daily.    . montelukast (SINGULAIR) 10 MG tablet Take 1 tablet by mouth daily.    . traZODone (DESYREL) 50 MG tablet Take 50-100 mg by mouth at bedtime.   0  . vortioxetine HBr (TRINTELLIX) 20 MG TABS Take 20 mg by mouth every other day.      No current facility-administered medications for this visit.     SURGICAL HISTORY:  Past Surgical History:  Procedure Laterality Date  . ABLATION     for graves dX  . COLONOSCOPY    . dental implant    . eye compression Bilateral    due to graves disease done at [facility]   . EYE SURGERY    . PARATHYROIDECTOMY    . VIDEO ASSISTED THORACOSCOPY (VATS)/ LOBECTOMY Left 08/13/2017   Procedure: VIDEO ASSISTED THORACOSCOPY (VATS)/ LOBECTOMY;  Surgeon: Melrose Nakayama, MD;  Location: [facility] OR;  Service: Thoracic;  Laterality: Left;    REVIEW OF SYSTEMS:  A comprehensive review  of systems was negative.   PHYSICAL EXAMINATION: General appearance: alert, cooperative and no distress Head: Normocephalic, without obvious abnormality, atraumatic Neck: no adenopathy, no JVD, supple, symmetrical, trachea midline and thyroid not enlarged, symmetric, no tenderness/mass/nodules Lymph nodes: Cervical, supraclavicular, and axillary nodes normal. Resp: clear to auscultation bilaterally Back: symmetric, no curvature. ROM normal. No CVA tenderness. Cardio: regular rate and rhythm, S1, S2 normal, no murmur, click, rub or gallop GI: soft, non-tender; bowel sounds normal; no masses,  no organomegaly Extremities: extremities  normal, atraumatic, no cyanosis or edema  ECOG PERFORMANCE STATUS: 1 - Symptomatic but completely ambulatory  Blood pressure 134/72, pulse 74, temperature 97.8 F (36.6 C), temperature source Oral, resp. rate 17, height _0  (1.499 m), weight 120 lb 3.2 oz (54.5 kg), SpO2 100 %.  LABORATORY DATA: Lab Results  Component Value Date   WBC 5.0 01/28/2018   HGB 12.9 01/28/2018   HCT 38.9 01/28/2018   MCV 89.9 01/28/2018   PLT 238 01/28/2018      Chemistry      Component Value Date/Time   NA 140 01/28/2018 0807   K 4.2 01/28/2018 0807   CL 103 01/28/2018 0807   CO2 28 01/28/2018 0807   BUN 12 01/28/2018 0807   CREATININE 0.74 01/28/2018 0807      Component Value Date/Time   CALCIUM 9.4 01/28/2018 0807   ALKPHOS 136 (H) 01/28/2018 0807   AST 15 01/28/2018 0807   ALT 15 01/28/2018 0807   BILITOT 0.2 (L) 01/28/2018 0807       RADIOGRAPHIC STUDIES: Ct Chest W Contrast  Result Date: 01/28/2018 CLINICAL DATA:  Status post left upper lobe segmentectomy 08/13/2017 for stage IIA lung adenocarcinoma. Interval observation. Restaging. EXAM: CT CHEST WITH CONTRAST TECHNIQUE: Multidetector CT imaging of the chest was performed during intravenous contrast administration. CONTRAST:  63m ISOVUE-300 IOPAMIDOL (ISOVUE-300) INJECTION 61% COMPARISON:  10/16/2017 chest radiograph. 06/26/2017 PET-CT. 05/22/2017 chest CT. FINDINGS: Cardiovascular: Normal heart size. No significant pericardial effusion/thickening. Atherosclerotic nonaneurysmal thoracic aorta. Normal caliber pulmonary arteries. No central pulmonary emboli. Mediastinum/Nodes: Atrophic thyroid gland with no discrete thyroid nodules. Unremarkable esophagus. No pathologically enlarged axillary, mediastinal or hilar lymph nodes. Lungs/Pleura: No pneumothorax. No pleural effusion. Status post left upper lobe segmentectomy. Thin curvilinear parenchymal band along surgical suture line compatible with mild postsurgical scarring. Moderate  centrilobular emphysema with mild diffuse bronchial wall thickening. No acute consolidative airspace disease, lung masses or significant pulmonary nodules. Upper abdomen: Small hiatal hernia. Musculoskeletal: No aggressive appearing focal osseous lesions. Moderate thoracic spondylosis. IMPRESSION: 1. No evidence of local tumor recurrence in the left upper lobe status post segmentectomy. 2. No evidence of metastatic disease in the chest. 3. Small hiatal hernia. Aortic Atherosclerosis (ICD10-I70.0) and Emphysema (ICD10-J43.9). Electronically Signed   By: JIlona SorrelM.D.   On: 01/28/2018 13:06    ASSESSMENT AND PLAN: This is a 67years old white female recently diagnosed with a stage IIA (T1a, N1, M0) non-small cell lung cancer, adenocarcinoma with no actionable mutations.  She is status post wedge resection of the left upper lobe nodule with lingular segmentectomy and mediastinal lymph node sampling under the care of Dr. HRoxan Hockeyon August 13, 2017. The patient declined adjuvant systemic chemotherapy.  She is currently on observation and she is feeling fine. Repeat CT scan of the chest showed no concerning findings for disease recurrence.  I discussed the scan results with the patient and her husband.  I recommended for the patient to continue on observation with repeat CT scan of  the chest in 6 months. She was advised to call immediately if she has any concerning symptoms in the interval. The patient voices understanding of current disease status and treatment options and is in agreement with the current care plan.  All questions were answered. The patient knows to call the clinic with any problems, questions or concerns. We can certainly see the patient much sooner if necessary.  Disclaimer: This note was dictated with voice recognition software. Similar sounding words can inadvertently be transcribed and may not be corrected upon review.

## 2018-04-05 DIAGNOSIS — E039 Hypothyroidism, unspecified: Secondary | ICD-10-CM | POA: Diagnosis not present

## 2018-04-16 DIAGNOSIS — E559 Vitamin D deficiency, unspecified: Secondary | ICD-10-CM | POA: Diagnosis not present

## 2018-04-16 DIAGNOSIS — E039 Hypothyroidism, unspecified: Secondary | ICD-10-CM | POA: Diagnosis not present

## 2018-04-16 DIAGNOSIS — I480 Paroxysmal atrial fibrillation: Secondary | ICD-10-CM | POA: Diagnosis not present

## 2018-04-16 DIAGNOSIS — R5383 Other fatigue: Secondary | ICD-10-CM | POA: Diagnosis not present

## 2018-04-16 DIAGNOSIS — R69 Illness, unspecified: Secondary | ICD-10-CM | POA: Diagnosis not present

## 2018-04-16 DIAGNOSIS — C3492 Malignant neoplasm of unspecified part of left bronchus or lung: Secondary | ICD-10-CM | POA: Diagnosis not present

## 2018-04-17 ENCOUNTER — Encounter: Payer: Self-pay | Admitting: Internal Medicine

## 2018-04-17 ENCOUNTER — Telehealth: Payer: Self-pay | Admitting: Internal Medicine

## 2018-04-17 ENCOUNTER — Telehealth: Payer: Self-pay | Admitting: *Deleted

## 2018-04-17 NOTE — Telephone Encounter (Signed)
Appt scheduled and patient has been notified of date/time per 10/9 sch msg

## 2018-04-17 NOTE — Telephone Encounter (Signed)
Called left a message regarding 12/23

## 2018-04-17 NOTE — Telephone Encounter (Signed)
Pt requesting to see MD 1 week after CT Scan 06/25/18. Message to scheduling.

## 2018-06-03 DIAGNOSIS — R69 Illness, unspecified: Secondary | ICD-10-CM | POA: Diagnosis not present

## 2018-06-25 ENCOUNTER — Ambulatory Visit (HOSPITAL_COMMUNITY)
Admission: RE | Admit: 2018-06-25 | Discharge: 2018-06-25 | Disposition: A | Discharge status: Home / Self Care | Payer: Medicare HMO | Source: Ambulatory Visit | Attending: Internal Medicine | Admitting: Internal Medicine

## 2018-06-25 ENCOUNTER — Telehealth: Payer: Self-pay | Admitting: Medical Oncology

## 2018-06-25 ENCOUNTER — Inpatient Hospital Stay: Payer: Medicare HMO | Attending: Internal Medicine

## 2018-06-25 ENCOUNTER — Encounter (HOSPITAL_COMMUNITY): Payer: Self-pay

## 2018-06-25 DIAGNOSIS — Z85118 Personal history of other malignant neoplasm of bronchus and lung: Secondary | ICD-10-CM | POA: Diagnosis not present

## 2018-06-25 DIAGNOSIS — C3490 Malignant neoplasm of unspecified part of unspecified bronchus or lung: Secondary | ICD-10-CM | POA: Insufficient documentation

## 2018-06-25 DIAGNOSIS — E039 Hypothyroidism, unspecified: Secondary | ICD-10-CM | POA: Diagnosis not present

## 2018-06-25 LAB — CMP (CANCER CENTER ONLY)
ALBUMIN: 4.1 g/dL (ref 3.5–5.0)
ALT: 21 U/L (ref 0–44)
AST: 20 U/L (ref 15–41)
Alkaline Phosphatase: 130 U/L — ABNORMAL HIGH (ref 38–126)
Anion gap: 9 (ref 5–15)
BUN: 11 mg/dL (ref 8–23)
CHLORIDE: 103 mmol/L (ref 98–111)
CO2: 29 mmol/L (ref 22–32)
Calcium: 9.8 mg/dL (ref 8.9–10.3)
Creatinine: 0.73 mg/dL (ref 0.44–1.00)
GFR, Est AFR Am: >60 mL/min (ref >60)
Glucose, Bld: 102 mg/dL — ABNORMAL HIGH (ref 70–99)
POTASSIUM: 3.8 mmol/L (ref 3.5–5.1)
SODIUM: 141 mmol/L (ref 135–145)
Total Bilirubin: 0.3 mg/dL (ref 0.3–1.2)
Total Protein: 7.1 g/dL (ref 6.5–8.1)

## 2018-06-25 LAB — CBC WITH DIFFERENTIAL (CANCER CENTER ONLY)
ABS IMMATURE GRANULOCYTES: 0.01 10*3/uL (ref 0.00–0.07)
BASOS ABS: 0 10*3/uL (ref 0.0–0.1)
Basophils Relative: 1 %
Eosinophils Absolute: 0.3 10*3/uL (ref 0.0–0.5)
Eosinophils Relative: 4 %
HCT: 40.9 % (ref 36.0–46.0)
HEMOGLOBIN: 13.4 g/dL (ref 12.0–15.0)
Immature Granulocytes: 0 %
LYMPHS PCT: 33 %
Lymphs Abs: 2.4 10*3/uL (ref 0.7–4.0)
MCH: 28.9 pg (ref 26.0–34.0)
MCHC: 32.8 g/dL (ref 30.0–36.0)
MCV: 88.3 fL (ref 80.0–100.0)
MONO ABS: 0.5 10*3/uL (ref 0.1–1.0)
Monocytes Relative: 6 %
NEUTROS ABS: 4.3 10*3/uL (ref 1.7–7.7)
Neutrophils Relative %: 56 %
Platelet Count: 282 10*3/uL (ref 150–400)
RBC: 4.63 MIL/uL (ref 3.87–5.11)
RDW: 13.8 % (ref 11.5–15.5)
WBC: 7.5 10*3/uL (ref 4.0–10.5)
nRBC: 0 % (ref 0.0–0.2)

## 2018-06-25 MED ORDER — IOHEXOL 300 MG/ML  SOLN
75.0000 mL | Freq: Once | INTRAMUSCULAR | Status: AC | PRN
  Administered 2018-06-25: 75 mL via INTRAVENOUS

## 2018-06-25 MED ORDER — SODIUM CHLORIDE (PF) 0.9 % IJ SOLN
INTRAMUSCULAR | Status: AC
  Filled 2018-06-25: qty 50

## 2018-06-25 NOTE — Telephone Encounter (Addendum)
Pt asking if Julien Nordmann will order TSH . "I am hypothyroid". She is prescribed synthroid by [facility] who left in nov. I told her Julien Nordmann will not order tsh .

## 2018-06-25 NOTE — Telephone Encounter (Addendum)
TSH lab-Spoke to staff at [facility] and requested she fax an order for tsh with dx code and we can send blood for tsh. Staff member said pt was supposed to be seen last week and did not show up. I left message for pt that we are getting order from [facility].

## 2018-06-26 ENCOUNTER — Inpatient Hospital Stay: Payer: Medicare HMO | Admitting: Internal Medicine

## 2018-06-26 ENCOUNTER — Telehealth: Payer: Self-pay

## 2018-06-26 ENCOUNTER — Encounter: Payer: Self-pay | Admitting: Internal Medicine

## 2018-06-26 VITALS — BP 142/82 | HR 77 | Temp 97.5°F | Resp 20 | Ht 59.0 in | Wt 126.2 lb

## 2018-06-26 DIAGNOSIS — Z85118 Personal history of other malignant neoplasm of bronchus and lung: Secondary | ICD-10-CM | POA: Diagnosis not present

## 2018-06-26 DIAGNOSIS — C3490 Malignant neoplasm of unspecified part of unspecified bronchus or lung: Secondary | ICD-10-CM

## 2018-06-26 DIAGNOSIS — C3492 Malignant neoplasm of unspecified part of left bronchus or lung: Secondary | ICD-10-CM

## 2018-06-26 DIAGNOSIS — F411 Generalized anxiety disorder: Secondary | ICD-10-CM

## 2018-06-26 NOTE — Telephone Encounter (Signed)
Printed avs and calender of upcoming appointment. Per 12/18 los

## 2018-06-26 NOTE — Progress Notes (Signed)
[place] [phone]   Fax:(336) [phone]  OFFICE PROGRESS NOTE  Patient, No Pcp Per No address on file  DIAGNOSIS: Stage IIA (T1a, N1, M0) non-small cell lung cancer, adenocarcinoma presented with right upper lobe small pulmonary nodules as well as metastatic adenocarcinoma to level 12L, diagnosed in February 2019.  Biomarker Findings Microsatellite status - Cannot Be Determined Tumor Mutational Burden - Cannot Be Determined Genomic Findings For a complete list of the genes assayed, please refer to the Appendix. KRAS G12V 7 Disease relevant genes with no reportable alterations: EGFR, ALK, BRAF, MET, RET, ERBB2, ROS1  PDL1 expression 5%.  PRIOR THERAPY: Status post wedge resection of the left upper lobe nodule with thoracic lingular segmentectomy and mediastinal lymph node sampling under the care of Dr. Roxan Hockey on August 13, 2017.  CURRENT THERAPY: Observation.  INTERVAL HISTORY: Rachael Villanueva 67 y.o. female returns to the clinic today for 6 months follow-up visit accompanied by her husband.  The patient is feeling fine today with no concerning complaints.  She denied having any chest pain, shortness of breath, cough or hemoptysis.  She has no recent weight loss or night sweats.  She has no nausea, vomiting, diarrhea or constipation.  She denied having any headache or visual changes.  The patient had repeat CT scan of the chest performed recently and she is here for evaluation and discussion of her risk her results.  MEDICAL HISTORY: Past Medical History:  Diagnosis Date  . Anxiety   . Depression   . Graves' disease   . Hypercalcemia   . Hypothyroidism    s/p post ablation for Graves Disease  . Lung nodule   . Sleep disorder   . Thyroid disease   . Vitamin D deficiency     ALLERGIES:  has No Known Allergies.  MEDICATIONS:  Current Outpatient Medications  Medication Sig Dispense Refill  . levothyroxine (SYNTHROID, LEVOTHROID)  112 MCG tablet Take 112 mcg by mouth daily before breakfast.    . ALPRAZolam (XANAX) 0.5 MG tablet Take 0.25-0.5 mg by mouth at bedtime as needed for anxiety or sleep.     . Armodafinil 250 MG tablet Take 250 mg by mouth daily.    . chlorhexidine (PERIDEX) 0.12 % solution Use as directed 15 mLs in the mouth or throat at bedtime.   98  . ergocalciferol (VITAMIN D2) 50000 units capsule Take 50,000 Units by mouth once a week.    . Homeopathic Products (SIMILASAN DRY EYE RELIEF OP) Apply 1 drop to eye daily as needed (dry eyes).    . montelukast (SINGULAIR) 10 MG tablet Take 1 tablet by mouth daily.    . traZODone (DESYREL) 50 MG tablet Take 50-100 mg by mouth at bedtime.   0  . vortioxetine HBr (TRINTELLIX) 20 MG TABS Take 20 mg by mouth every other day.      No current facility-administered medications for this visit.     SURGICAL HISTORY:  Past Surgical History:  Procedure Laterality Date  . ABLATION     for graves dX  . COLONOSCOPY    . dental implant    . eye compression Bilateral    due to graves disease done at [facility]   . EYE SURGERY    . PARATHYROIDECTOMY    . VIDEO ASSISTED THORACOSCOPY (VATS)/ LOBECTOMY Left 08/13/2017   Procedure: VIDEO ASSISTED THORACOSCOPY (VATS)/ LOBECTOMY;  Surgeon: Melrose Nakayama, MD;  Location: [place];  Service: Thoracic;  Laterality: Left;  REVIEW OF SYSTEMS:  A comprehensive review of systems was negative.   PHYSICAL EXAMINATION: General appearance: alert, cooperative and no distress Head: Normocephalic, without obvious abnormality, atraumatic Neck: no adenopathy, no JVD, supple, symmetrical, trachea midline and thyroid not enlarged, symmetric, no tenderness/mass/nodules Lymph nodes: Cervical, supraclavicular, and axillary nodes normal. Resp: clear to auscultation bilaterally Back: symmetric, no curvature. ROM normal. No CVA tenderness. Cardio: regular rate and rhythm, S1, S2 normal, no murmur, click, rub or gallop GI: soft, non-tender; bowel  sounds normal; no masses,  no organomegaly Extremities: extremities normal, atraumatic, no cyanosis or edema  ECOG PERFORMANCE STATUS: 0 - Asymptomatic  Blood pressure (!) 142/82, pulse 77, temperature (!) 97.5 F (36.4 C), temperature source Oral, resp. rate 20, height 4' 11" (1.499 m), weight 126 lb 3.2 oz (57.2 kg), SpO2 100 %.  LABORATORY DATA: Lab Results  Component Value Date   WBC 7.5 06/25/2018   HGB 13.4 06/25/2018   HCT 40.9 06/25/2018   MCV 88.3 06/25/2018   PLT 282 06/25/2018      Chemistry      Component Value Date/Time   NA 141 06/25/2018 0834   K 3.8 06/25/2018 0834   CL 103 06/25/2018 0834   CO2 29 06/25/2018 0834   BUN 11 06/25/2018 0834   CREATININE 0.73 06/25/2018 0834      Component Value Date/Time   CALCIUM 9.8 06/25/2018 0834   ALKPHOS 130 (H) 06/25/2018 0834   AST 20 06/25/2018 0834   ALT 21 06/25/2018 0834   BILITOT 0.3 06/25/2018 0834       RADIOGRAPHIC STUDIES: Ct Chest W Contrast  Result Date: 06/25/2018 CLINICAL DATA:  Non-small-cell lung cancer (adenocarcinoma) status post left upper lobe wedge resection 08/13/2017. EXAM: CT CHEST WITH CONTRAST TECHNIQUE: Multidetector CT imaging of the chest was performed during intravenous contrast administration. CONTRAST:  90m OMNIPAQUE IOHEXOL 300 MG/ML  SOLN COMPARISON:  Chest CT 01/28/2018 and 05/22/2017.  PET-CT 06/26/2017. FINDINGS: Cardiovascular: Mild aortic atherosclerosis. No acute vascular findings. Normal heart size. Stable fluid in the pericardial recesses. Mediastinum/Nodes: There are no enlarged mediastinal, hilar or axillary lymph nodes. Atrophied thyroid gland and small hiatal hernia. Lungs/Pleura: There is no pleural effusion or pneumothorax. There is stable mild left upper lobe scarring status post wedge resection. No evidence of local recurrence or suspicious pulmonary nodule. There is stable linear scarring in the right lower lobe and mild underlying emphysema. Upper abdomen: The  visualized upper abdomen appears stable without suspicious findings. Musculoskeletal/Chest wall: There is no chest wall mass or suspicious osseous finding. The bones are demineralized. Stable mild superior endplate irregularities at C7 and T1 and partial ankylosis of the T4 through T6 vertebral bodies. IMPRESSION: 1. Stable postoperative chest. No evidence of local recurrence or metastatic disease. 2. Mild Aortic Atherosclerosis (ICD10-I70.0) and Emphysema (ICD10-J43.9). Electronically Signed   By: WRichardean SaleM.D.   On: 06/25/2018 13:16    ASSESSMENT AND PLAN: This is a 67years old white female recently diagnosed with a stage IIA (T1a, N1, M0) non-small cell lung cancer, adenocarcinoma with no actionable mutations.  She is status post wedge resection of the left upper lobe nodule with lingular segmentectomy and mediastinal lymph node sampling under the care of Dr. HRoxan Hockeyon August 13, 2017. The patient declined adjuvant systemic chemotherapy.  The patient is currently on observation and she is feeling very well. She had repeat CT scan of the chest performed recently.  I personally and independently reviewed the scan and discussed the results with the patient  and her husband today.  Her scan showed no concerning findings for disease recurrence or progression. I recommended for her to continue on observation with repeat CT scan of the chest in 6 months. The patient was advised to take her blood pressure medication as recommended and to consult with her primary care physician for adjustment of her medication if needed. She was advised to call immediately if she has any concerning symptoms in the interval. The patient voices understanding of current disease status and treatment options and is in agreement with the current care plan.  All questions were answered. The patient knows to call the clinic with any problems, questions or concerns. We can certainly see the patient much sooner if  necessary.  Disclaimer: This note was dictated with voice recognition software. Similar sounding words can inadvertently be transcribed and may not be corrected upon review.

## 2018-06-27 DIAGNOSIS — R599 Enlarged lymph nodes, unspecified: Secondary | ICD-10-CM | POA: Diagnosis not present

## 2018-07-01 ENCOUNTER — Ambulatory Visit: Payer: Medicare HMO | Admitting: Internal Medicine

## 2018-07-01 ENCOUNTER — Telehealth: Payer: Self-pay | Admitting: Medical Oncology

## 2018-07-01 NOTE — Telephone Encounter (Signed)
Looking for TSH. I told Judeen Hammans it was sent to [facility] on 12/17.

## 2018-07-30 ENCOUNTER — Telehealth: Payer: Self-pay | Admitting: Internal Medicine

## 2018-07-30 NOTE — Telephone Encounter (Signed)
Patient called to cancel °

## 2018-08-02 ENCOUNTER — Inpatient Hospital Stay: Payer: Medicare HMO

## 2018-08-06 ENCOUNTER — Ambulatory Visit: Payer: Medicare HMO | Admitting: Internal Medicine

## 2018-08-13 DIAGNOSIS — E538 Deficiency of other specified B group vitamins: Secondary | ICD-10-CM | POA: Diagnosis not present

## 2018-08-13 DIAGNOSIS — E039 Hypothyroidism, unspecified: Secondary | ICD-10-CM | POA: Diagnosis not present

## 2018-08-13 DIAGNOSIS — C3492 Malignant neoplasm of unspecified part of left bronchus or lung: Secondary | ICD-10-CM | POA: Diagnosis not present

## 2018-08-13 DIAGNOSIS — I480 Paroxysmal atrial fibrillation: Secondary | ICD-10-CM | POA: Diagnosis not present

## 2018-08-13 DIAGNOSIS — J439 Emphysema, unspecified: Secondary | ICD-10-CM | POA: Diagnosis not present

## 2018-08-13 DIAGNOSIS — J302 Other seasonal allergic rhinitis: Secondary | ICD-10-CM | POA: Diagnosis not present

## 2018-08-20 DIAGNOSIS — E538 Deficiency of other specified B group vitamins: Secondary | ICD-10-CM | POA: Diagnosis not present

## 2018-08-27 DIAGNOSIS — E538 Deficiency of other specified B group vitamins: Secondary | ICD-10-CM | POA: Diagnosis not present

## 2018-09-03 DIAGNOSIS — E538 Deficiency of other specified B group vitamins: Secondary | ICD-10-CM | POA: Diagnosis not present

## 2018-09-11 DIAGNOSIS — J302 Other seasonal allergic rhinitis: Secondary | ICD-10-CM | POA: Diagnosis not present

## 2018-09-11 DIAGNOSIS — Z1389 Encounter for screening for other disorder: Secondary | ICD-10-CM | POA: Diagnosis not present

## 2018-09-11 DIAGNOSIS — I4891 Unspecified atrial fibrillation: Secondary | ICD-10-CM | POA: Diagnosis not present

## 2018-09-11 DIAGNOSIS — J439 Emphysema, unspecified: Secondary | ICD-10-CM | POA: Diagnosis not present

## 2018-09-11 DIAGNOSIS — E538 Deficiency of other specified B group vitamins: Secondary | ICD-10-CM | POA: Diagnosis not present

## 2018-09-11 DIAGNOSIS — Z1239 Encounter for other screening for malignant neoplasm of breast: Secondary | ICD-10-CM | POA: Diagnosis not present

## 2018-09-11 DIAGNOSIS — I700 Atherosclerosis of aorta: Secondary | ICD-10-CM | POA: Diagnosis not present

## 2018-09-11 DIAGNOSIS — Z0000 Encounter for general adult medical examination without abnormal findings: Secondary | ICD-10-CM | POA: Diagnosis not present

## 2018-09-11 DIAGNOSIS — E039 Hypothyroidism, unspecified: Secondary | ICD-10-CM | POA: Diagnosis not present

## 2018-09-11 DIAGNOSIS — Z1211 Encounter for screening for malignant neoplasm of colon: Secondary | ICD-10-CM | POA: Diagnosis not present

## 2018-09-11 DIAGNOSIS — C3492 Malignant neoplasm of unspecified part of left bronchus or lung: Secondary | ICD-10-CM | POA: Diagnosis not present

## 2018-09-17 DIAGNOSIS — Z0000 Encounter for general adult medical examination without abnormal findings: Secondary | ICD-10-CM | POA: Diagnosis not present

## 2018-10-03 DIAGNOSIS — E538 Deficiency of other specified B group vitamins: Secondary | ICD-10-CM | POA: Diagnosis not present

## 2018-12-10 DIAGNOSIS — E538 Deficiency of other specified B group vitamins: Secondary | ICD-10-CM | POA: Diagnosis not present

## 2018-12-10 DIAGNOSIS — E039 Hypothyroidism, unspecified: Secondary | ICD-10-CM | POA: Diagnosis not present

## 2018-12-24 ENCOUNTER — Inpatient Hospital Stay: Payer: Medicare HMO | Attending: Internal Medicine

## 2018-12-24 ENCOUNTER — Encounter (HOSPITAL_COMMUNITY): Payer: Self-pay

## 2018-12-24 ENCOUNTER — Other Ambulatory Visit: Payer: Self-pay | Admitting: Internal Medicine

## 2018-12-24 ENCOUNTER — Other Ambulatory Visit: Payer: Self-pay

## 2018-12-24 ENCOUNTER — Ambulatory Visit (HOSPITAL_COMMUNITY)
Admission: RE | Admit: 2018-12-24 | Discharge: 2018-12-24 | Disposition: A | Discharge status: Home / Self Care | Payer: Medicare HMO | Source: Ambulatory Visit | Attending: Internal Medicine | Admitting: Internal Medicine

## 2018-12-24 DIAGNOSIS — C3490 Malignant neoplasm of unspecified part of unspecified bronchus or lung: Secondary | ICD-10-CM

## 2018-12-24 DIAGNOSIS — J439 Emphysema, unspecified: Secondary | ICD-10-CM | POA: Diagnosis not present

## 2018-12-24 DIAGNOSIS — J9809 Other diseases of bronchus, not elsewhere classified: Secondary | ICD-10-CM | POA: Diagnosis not present

## 2018-12-24 DIAGNOSIS — Z85118 Personal history of other malignant neoplasm of bronchus and lung: Secondary | ICD-10-CM | POA: Insufficient documentation

## 2018-12-24 DIAGNOSIS — I2699 Other pulmonary embolism without acute cor pulmonale: Secondary | ICD-10-CM | POA: Insufficient documentation

## 2018-12-24 DIAGNOSIS — M79606 Pain in leg, unspecified: Secondary | ICD-10-CM | POA: Insufficient documentation

## 2018-12-24 DIAGNOSIS — M7989 Other specified soft tissue disorders: Secondary | ICD-10-CM | POA: Insufficient documentation

## 2018-12-24 DIAGNOSIS — Z7901 Long term (current) use of anticoagulants: Secondary | ICD-10-CM | POA: Insufficient documentation

## 2018-12-24 LAB — CBC WITH DIFFERENTIAL (CANCER CENTER ONLY)
Abs Immature Granulocytes: 0.03 10*3/uL (ref 0.00–0.07)
Basophils Absolute: 0 10*3/uL (ref 0.0–0.1)
Basophils Relative: 1 %
Eosinophils Absolute: 0.2 10*3/uL (ref 0.0–0.5)
Eosinophils Relative: 3 %
HCT: 39.3 % (ref 36.0–46.0)
Hemoglobin: 12.5 g/dL (ref 12.0–15.0)
Immature Granulocytes: 0 %
Lymphocytes Relative: 27 %
Lymphs Abs: 1.9 10*3/uL (ref 0.7–4.0)
MCH: 28.7 pg (ref 26.0–34.0)
MCHC: 31.8 g/dL (ref 30.0–36.0)
MCV: 90.3 fL (ref 80.0–100.0)
Monocytes Absolute: 0.5 10*3/uL (ref 0.1–1.0)
Monocytes Relative: 7 %
Neutro Abs: 4.3 10*3/uL (ref 1.7–7.7)
Neutrophils Relative %: 62 %
Platelet Count: 313 10*3/uL (ref 150–400)
RBC: 4.35 MIL/uL (ref 3.87–5.11)
RDW: 14.6 % (ref 11.5–15.5)
WBC Count: 7 10*3/uL (ref 4.0–10.5)
nRBC: 0 % (ref 0.0–0.2)

## 2018-12-24 LAB — CMP (CANCER CENTER ONLY)
ALT: 72 U/L — ABNORMAL HIGH (ref 0–44)
AST: 29 U/L (ref 15–41)
Albumin: 4.2 g/dL (ref 3.5–5.0)
Alkaline Phosphatase: 185 U/L — ABNORMAL HIGH (ref 38–126)
Anion gap: 11 (ref 5–15)
BUN: 14 mg/dL (ref 8–23)
CO2: 26 mmol/L (ref 22–32)
Calcium: 10.1 mg/dL (ref 8.9–10.3)
Chloride: 101 mmol/L (ref 98–111)
Creatinine: 0.79 mg/dL (ref 0.44–1.00)
GFR, Est AFR Am: >60 mL/min (ref >60)
GFR, Estimated: >60 mL/min (ref >60)
Glucose, Bld: 118 mg/dL — ABNORMAL HIGH (ref 70–99)
Potassium: 4.6 mmol/L (ref 3.5–5.1)
Sodium: 138 mmol/L (ref 135–145)
Total Bilirubin: 0.2 mg/dL — ABNORMAL LOW (ref 0.3–1.2)
Total Protein: 7.4 g/dL (ref 6.5–8.1)

## 2018-12-24 MED ORDER — RIVAROXABAN (XARELTO) VTE STARTER PACK (15 & 20 MG): ORAL_TABLET | ORAL | 0 refills | Status: DC

## 2018-12-24 MED ORDER — IOHEXOL 300 MG/ML  SOLN
75.0000 mL | Freq: Once | INTRAMUSCULAR | Status: AC | PRN
  Administered 2018-12-24: 75 mL via INTRAVENOUS

## 2018-12-24 MED ORDER — SODIUM CHLORIDE (PF) 0.9 % IJ SOLN
INTRAMUSCULAR | Status: AC
  Filled 2018-12-24: qty 50

## 2018-12-26 ENCOUNTER — Encounter: Payer: Self-pay | Admitting: *Deleted

## 2018-12-26 ENCOUNTER — Inpatient Hospital Stay (HOSPITAL_BASED_OUTPATIENT_CLINIC_OR_DEPARTMENT_OTHER): Payer: Medicare HMO | Admitting: Internal Medicine

## 2018-12-26 ENCOUNTER — Other Ambulatory Visit: Payer: Self-pay

## 2018-12-26 ENCOUNTER — Encounter: Payer: Self-pay | Admitting: Internal Medicine

## 2018-12-26 VITALS — BP 151/83 | HR 75 | Temp 97.7°F | Resp 20 | Ht 59.0 in | Wt 130.6 lb

## 2018-12-26 DIAGNOSIS — M7989 Other specified soft tissue disorders: Secondary | ICD-10-CM | POA: Diagnosis not present

## 2018-12-26 DIAGNOSIS — Z85118 Personal history of other malignant neoplasm of bronchus and lung: Secondary | ICD-10-CM | POA: Diagnosis not present

## 2018-12-26 DIAGNOSIS — C3490 Malignant neoplasm of unspecified part of unspecified bronchus or lung: Secondary | ICD-10-CM

## 2018-12-26 DIAGNOSIS — C3492 Malignant neoplasm of unspecified part of left bronchus or lung: Secondary | ICD-10-CM

## 2018-12-26 DIAGNOSIS — I2699 Other pulmonary embolism without acute cor pulmonale: Secondary | ICD-10-CM

## 2018-12-26 DIAGNOSIS — K769 Liver disease, unspecified: Secondary | ICD-10-CM | POA: Diagnosis not present

## 2018-12-26 DIAGNOSIS — Z7901 Long term (current) use of anticoagulants: Secondary | ICD-10-CM | POA: Diagnosis not present

## 2018-12-26 DIAGNOSIS — M79606 Pain in leg, unspecified: Secondary | ICD-10-CM | POA: Diagnosis not present

## 2018-12-26 NOTE — Progress Notes (Signed)
Oncology Nurse Navigator Documentation  Oncology Nurse Navigator Flowsheets 12/26/2018  Navigator Location [facility]  Navigator Encounter Type Clinic/MDC/I spoke with patient at cancer center appt today with Dr. Julien Nordmann.  She has PE and is on anti-coagulation at this time.  She will have a doppler of lower extremities and follow up with Dr. Julien Nordmann in 6 months.  She verbalized understanding next steps.  She would like her ct scan and labs forward to Dr. Fara Olden.  Per Dr. Julien Nordmann this has been routed in EMR  Telephone -  Abnormal Finding Date -  Confirmed Diagnosis Date -  Surgery Date -  Treatment Initiated Date -  Patient Visit Type Follow-up  Treatment Phase Other  Barriers/Navigation Needs Coordination of Care  Education -  Interventions Coordination of Care  Coordination of Care Other  Education Method -  Acuity Level 2  Time Spent with Patient 64

## 2018-12-26 NOTE — Progress Notes (Signed)
[place] Telephone:([phone]   Fax:(336) [phone]  OFFICE PROGRESS NOTE  Default, Provider, MD No address on file  DIAGNOSIS:  1) Stage IIA (T1a, N1, M0) non-small cell lung cancer, adenocarcinoma presented with right upper lobe small pulmonary nodules as well as metastatic adenocarcinoma to level 12L, diagnosed in February 2019. 2) acute pulmonary embolism incidentally diagnosed on CT scan of the chest on 12/24/2018 involving right lower lobe pulmonary artery segmental branches.  Biomarker Findings Microsatellite status - Cannot Be Determined Tumor Mutational Burden - Cannot Be Determined Genomic Findings For a complete list of the genes assayed, please refer to the Appendix. KRAS G12V 7 Disease relevant genes with no reportable alterations: EGFR, ALK, BRAF, MET, RET, ERBB2, ROS1  PDL1 expression 5%.  PRIOR THERAPY: Status post wedge resection of the left upper lobe nodule with thoracic lingular segmentectomy and mediastinal lymph node sampling under the care of Dr. Roxan Hockey on August 13, 2017.  CURRENT THERAPY:  1) observation for lung cancer. 2) Xarelto 15 mg p.o. twice daily for 3 weeks followed by 20 mg p.o. daily 20 mg p.o. daily.  She started the first dose on 12/25/2018.  INTERVAL HISTORY: Rachael Villanueva 68 y.o. female returns to the clinic today for follow-up visit.  The patient is feeling fine today with no concerning complaints except for aching pain and tightness in the lower extremities left more than right.  She denied having any chest pain, shortness of breath, cough or hemoptysis.  She denied having any recent weight loss or night sweats.  She has no headache or visual changes.  She had a recent facial lift as well as abdominal liposuction performed at [facility] on 12/16/2018.  The patient had CT scan of the chest performed on 12/24/2018 and that showed acute segmental pulmonary embolism involving the right lower lobe.  I called the  patient with the result 2 days ago and started him on Xarelto initially 15 mg p.o twice daily for 3 weeks followed by 20 mg p.o. daily.  She is here today for evaluation and more discussion of her scan results and treatment options.  MEDICAL HISTORY: Past Medical History:  Diagnosis Date   Anxiety    Depression    Graves' disease    Hypercalcemia    Hypothyroidism    s/p post ablation for Graves Disease   Lung nodule    Sleep disorder    Thyroid disease    Vitamin D deficiency     ALLERGIES:  has No Known Allergies.  MEDICATIONS:  Current Outpatient Medications  Medication Sig Dispense Refill   escitalopram (LEXAPRO) 10 MG tablet Take 10 mg by mouth daily.     ALPRAZolam (XANAX) 0.5 MG tablet Take 0.25-0.5 mg by mouth at bedtime as needed for anxiety or sleep.      Armodafinil 250 MG tablet Take 250 mg by mouth daily.     chlorhexidine (PERIDEX) 0.12 % solution Use as directed 15 mLs in the mouth or throat at bedtime.   98   ergocalciferol (VITAMIN D2) 50000 units capsule Take 50,000 Units by mouth once a week.     Homeopathic Products (SIMILASAN DRY EYE RELIEF OP) Apply 1 drop to eye daily as needed (dry eyes).     levothyroxine (SYNTHROID, LEVOTHROID) 112 MCG tablet Take 112 mcg by mouth daily before breakfast.     Rivaroxaban 15 & 20 MG TBPK Take as directed on package: Start with one 51m tablet by mouth twice a  day with food. On Day 22, switch to one '20mg'$  tablet once a day with food. 51 each 0   traZODone (DESYREL) 50 MG tablet Take 50-100 mg by mouth at bedtime.   0   No current facility-administered medications for this visit.     SURGICAL HISTORY:  Past Surgical History:  Procedure Laterality Date   ABLATION     for graves dX   COLONOSCOPY     dental implant     eye compression Bilateral    due to graves disease done at [place] (VATS)/ LOBECTOMY Left 08/13/2017   Procedure:  VIDEO ASSISTED THORACOSCOPY (VATS)/ LOBECTOMY;  Surgeon: Melrose Nakayama, MD;  Location: [place];  Service: Thoracic;  Laterality: Left;    REVIEW OF SYSTEMS:  Constitutional: negative Eyes: negative Ears, nose, mouth, throat, and face: negative Respiratory: negative Cardiovascular: negative Gastrointestinal: negative Genitourinary:negative Integument/breast: negative Hematologic/lymphatic: negative Musculoskeletal:positive for arthralgias Neurological: negative Behavioral/Psych: negative Endocrine: negative Allergic/Immunologic: negative   PHYSICAL EXAMINATION: General appearance: alert, cooperative and no distress Head: Normocephalic, without obvious abnormality, atraumatic Neck: no adenopathy, no JVD, supple, symmetrical, trachea midline and thyroid not enlarged, symmetric, no tenderness/mass/nodules Lymph nodes: Cervical, supraclavicular, and axillary nodes normal. Resp: clear to auscultation bilaterally Back: symmetric, no curvature. ROM normal. No CVA tenderness. Cardio: regular rate and rhythm, S1, S2 normal, no murmur, click, rub or gallop GI: soft, non-tender; bowel sounds normal; no masses,  no organomegaly Extremities: extremities normal, atraumatic, no cyanosis or edema and edema trace edema Neurologic: Alert and oriented X 3, normal strength and tone. Normal symmetric reflexes. Normal coordination and gait  ECOG PERFORMANCE STATUS: 0 - Asymptomatic  Blood pressure (!) 151/83, pulse 75, temperature 97.7 F (36.5 C), temperature source Oral, resp. rate 20, height '4\' 11"'$  (1.499 m), weight 130 lb 9.6 oz (59.2 kg), SpO2 97 %.  LABORATORY DATA: Lab Results  Component Value Date   WBC 7.0 12/24/2018   HGB 12.5 12/24/2018   HCT 39.3 12/24/2018   MCV 90.3 12/24/2018   PLT 313 12/24/2018      Chemistry      Component Value Date/Time   NA 138 12/24/2018 0806   K 4.6 12/24/2018 0806   CL 101 12/24/2018 0806   CO2 26 12/24/2018 0806   BUN 14 12/24/2018 0806    CREATININE 0.79 12/24/2018 0806      Component Value Date/Time   CALCIUM 10.1 12/24/2018 0806   ALKPHOS 185 (H) 12/24/2018 0806   AST 29 12/24/2018 0806   ALT 72 (H) 12/24/2018 0806   BILITOT 0.2 (L) 12/24/2018 0806       RADIOGRAPHIC STUDIES: Ct Chest W Contrast  Result Date: 12/24/2018 CLINICAL DATA:  Follow-up lung cancer.  Non-small cell. EXAM: CT CHEST WITH CONTRAST TECHNIQUE: Multidetector CT imaging of the chest was performed during intravenous contrast administration. CONTRAST:  29m OMNIPAQUE IOHEXOL 300 MG/ML  SOLN COMPARISON:  06/25/2018 FINDINGS: Cardiovascular: The heart size is normal. Aortic atherosclerosis. No pericardial effusion identified.There are filling defects within the superior segmental and posterior segmental branches of the right lower lobe pulmonary artery compatible with acute pulmonary emboli. New from 06/25/2018. Left main coronary artery calcifications. Mediastinum/Nodes: Thyroid gland atrophic or surgically absent. The trachea appears patent and is midline. Normal appearance of the esophagus. No supraclavicular or axillary adenopathy. No mediastinal or hilar adenopathy. Lungs/Pleura: No pleural effusion. Postoperative change from partial left upper lobectomy. The no findings to suggest  local tumor recurrence. Emphysema with diffuse bronchial wall thickening. Stable linear scar within the posterior and medial right lower lobe. No new or enlarging pulmonary nodules. Upper Abdomen: The adrenal glands appear within normal limits. No acute findings identified within the upper abdomen. Musculoskeletal: Lumbar spondylosis. No aggressive lytic or sclerotic bone lesions. IMPRESSION: 1. No findings identified to suggest residual or recurrent tumor status post partial left upper lobectomy. 2. New segmental filling defects within the right lower lobe pulmonary artery compatible with acute pulmonary embolus. 3. Aortic Atherosclerosis (ICD10-I70.0) and Emphysema (ICD10-J43.9).  Electronically Signed   By: Kerby Moors M.D.   On: 12/24/2018 12:28    ASSESSMENT AND PLAN: This is a 68 years old white female recently diagnosed with a stage IIA (T1a, N1, M0) non-small cell lung cancer, adenocarcinoma with no actionable mutations.  She is status post wedge resection of the left upper lobe nodule with lingular segmentectomy and mediastinal lymph node sampling under the care of Dr. Roxan Hockey on August 13, 2017. The patient declined adjuvant systemic chemotherapy.  The patient is feeling fine today with no concerning complaints except for some aching pain in the lower extremities and mild swelling. She had repeat CT scan of the chest performed recently.  I personally and independently reviewed the scan images and discussed the result and showed the images to the patient today. Her scan showed no concerning findings for disease recurrence but unfortunately it showed new segmental filling defect within the right lower lobe pulmonary artery compatible with acute pulmonary embolus.  The patient has a lot of question about her blood clot and the underlying etiology.  She recently had abdominal surgery with liposuction as well as facial lifting.  This could be complication of her recent surgery.  She denied having any recent travel. I recommended for the patient to continue with her new prescription of Xarelto initially 15 mg p.o. twice daily for 3 weeks followed by 20 mg p.o. daily for at least 6-12 months. I will also arrange for the patient to have Doppler of the lower extremities to rule out acute venous thrombosis especially with the swelling and pain in her lower extremities. She will come back for follow-up visit in 6 months for evaluation and repeat CT scan of the chest for restaging of her disease. For the blood pressure liver dysfunction, she will follow with her primary care physician for evaluation of these abnormalities. The patient was advised to call immediately if she has  any concerning symptoms in the interval. The patient voices understanding of current disease status and treatment options and is in agreement with the current care plan.  All questions were answered. The patient knows to call the clinic with any problems, questions or concerns. We can certainly see the patient much sooner if necessary.  Disclaimer: This note was dictated with voice recognition software. Similar sounding words can inadvertently be transcribed and may not be corrected upon review.

## 2018-12-27 ENCOUNTER — Ambulatory Visit (HOSPITAL_COMMUNITY)
Admission: RE | Admit: 2018-12-27 | Discharge: 2018-12-27 | Disposition: A | Discharge status: Home / Self Care | Payer: Medicare HMO | Source: Ambulatory Visit | Attending: Internal Medicine | Admitting: Internal Medicine

## 2018-12-27 ENCOUNTER — Telehealth: Payer: Self-pay | Admitting: Medical Oncology

## 2018-12-27 DIAGNOSIS — I2699 Other pulmonary embolism without acute cor pulmonale: Secondary | ICD-10-CM | POA: Insufficient documentation

## 2018-12-27 NOTE — Progress Notes (Signed)
LE venous duplex       has been completed. Preliminary results can be found under CV proc through chart review. June Leap, BS, RDMS, RVT   Attempted call report to Dr. Julien Nordmann. Left voice message on nurse line.

## 2018-12-27 NOTE — Telephone Encounter (Signed)
Vascular lab called - bilateral US for DVT- results negative x 2.

## 2018-12-30 ENCOUNTER — Telehealth: Payer: Self-pay | Admitting: Internal Medicine

## 2018-12-30 NOTE — Telephone Encounter (Signed)
Scheduled per los. Mailed printout  °

## 2019-01-17 DIAGNOSIS — I480 Paroxysmal atrial fibrillation: Secondary | ICD-10-CM | POA: Diagnosis not present

## 2019-01-17 DIAGNOSIS — E039 Hypothyroidism, unspecified: Secondary | ICD-10-CM | POA: Diagnosis not present

## 2019-01-17 DIAGNOSIS — C3492 Malignant neoplasm of unspecified part of left bronchus or lung: Secondary | ICD-10-CM | POA: Diagnosis not present

## 2019-01-17 DIAGNOSIS — E538 Deficiency of other specified B group vitamins: Secondary | ICD-10-CM | POA: Diagnosis not present

## 2019-01-17 DIAGNOSIS — I2699 Other pulmonary embolism without acute cor pulmonale: Secondary | ICD-10-CM | POA: Diagnosis not present

## 2019-02-26 DIAGNOSIS — H524 Presbyopia: Secondary | ICD-10-CM | POA: Diagnosis not present

## 2019-04-30 DIAGNOSIS — E039 Hypothyroidism, unspecified: Secondary | ICD-10-CM | POA: Diagnosis not present

## 2019-04-30 DIAGNOSIS — I480 Paroxysmal atrial fibrillation: Secondary | ICD-10-CM | POA: Diagnosis not present

## 2019-04-30 DIAGNOSIS — M62838 Other muscle spasm: Secondary | ICD-10-CM | POA: Diagnosis not present

## 2019-04-30 DIAGNOSIS — I2699 Other pulmonary embolism without acute cor pulmonale: Secondary | ICD-10-CM | POA: Diagnosis not present

## 2019-04-30 DIAGNOSIS — Z23 Encounter for immunization: Secondary | ICD-10-CM | POA: Diagnosis not present

## 2019-04-30 DIAGNOSIS — E538 Deficiency of other specified B group vitamins: Secondary | ICD-10-CM | POA: Diagnosis not present

## 2019-04-30 DIAGNOSIS — C3492 Malignant neoplasm of unspecified part of left bronchus or lung: Secondary | ICD-10-CM | POA: Diagnosis not present

## 2019-05-14 ENCOUNTER — Ambulatory Visit
Admission: RE | Admit: 2019-05-14 | Discharge: 2019-05-14 | Disposition: A | Discharge status: Home / Self Care | Payer: Medicare HMO | Source: Ambulatory Visit | Attending: Internal Medicine | Admitting: Internal Medicine

## 2019-05-14 ENCOUNTER — Other Ambulatory Visit: Payer: Self-pay | Admitting: Internal Medicine

## 2019-05-14 DIAGNOSIS — M50322 Other cervical disc degeneration at C5-C6 level: Secondary | ICD-10-CM | POA: Diagnosis not present

## 2019-05-14 DIAGNOSIS — M62838 Other muscle spasm: Secondary | ICD-10-CM

## 2019-05-14 DIAGNOSIS — M4802 Spinal stenosis, cervical region: Secondary | ICD-10-CM | POA: Diagnosis not present

## 2019-05-14 DIAGNOSIS — E559 Vitamin D deficiency, unspecified: Secondary | ICD-10-CM | POA: Diagnosis not present

## 2019-06-24 ENCOUNTER — Ambulatory Visit: Payer: Medicare HMO | Admitting: Physical Therapy

## 2019-06-27 ENCOUNTER — Other Ambulatory Visit: Payer: Self-pay

## 2019-06-27 ENCOUNTER — Ambulatory Visit (HOSPITAL_COMMUNITY)
Admission: RE | Admit: 2019-06-27 | Discharge: 2019-06-27 | Disposition: A | Discharge status: Home / Self Care | Payer: Medicare HMO | Source: Ambulatory Visit | Attending: Internal Medicine | Admitting: Internal Medicine

## 2019-06-27 ENCOUNTER — Inpatient Hospital Stay: Payer: Medicare HMO | Attending: Internal Medicine

## 2019-06-27 DIAGNOSIS — R69 Illness, unspecified: Secondary | ICD-10-CM | POA: Diagnosis not present

## 2019-06-27 DIAGNOSIS — C3492 Malignant neoplasm of unspecified part of left bronchus or lung: Secondary | ICD-10-CM

## 2019-06-27 DIAGNOSIS — R911 Solitary pulmonary nodule: Secondary | ICD-10-CM | POA: Diagnosis present

## 2019-06-27 DIAGNOSIS — Z85118 Personal history of other malignant neoplasm of bronchus and lung: Secondary | ICD-10-CM | POA: Diagnosis not present

## 2019-06-27 DIAGNOSIS — C3490 Malignant neoplasm of unspecified part of unspecified bronchus or lung: Secondary | ICD-10-CM | POA: Diagnosis not present

## 2019-06-27 DIAGNOSIS — Z86711 Personal history of pulmonary embolism: Secondary | ICD-10-CM | POA: Insufficient documentation

## 2019-06-27 DIAGNOSIS — I10 Essential (primary) hypertension: Secondary | ICD-10-CM | POA: Insufficient documentation

## 2019-06-27 DIAGNOSIS — F419 Anxiety disorder, unspecified: Secondary | ICD-10-CM | POA: Insufficient documentation

## 2019-06-27 LAB — CMP (CANCER CENTER ONLY)
ALT: 25 U/L (ref 0–44)
AST: 24 U/L (ref 15–41)
Albumin: 4 g/dL (ref 3.5–5.0)
Alkaline Phosphatase: 106 U/L (ref 38–126)
Anion gap: 10 (ref 5–15)
BUN: 20 mg/dL (ref 8–23)
CO2: 27 mmol/L (ref 22–32)
Calcium: 9 mg/dL (ref 8.9–10.3)
Chloride: 104 mmol/L (ref 98–111)
Creatinine: 0.81 mg/dL (ref 0.44–1.00)
GFR, Est AFR Am: >60 mL/min (ref >60)
GFR, Estimated: >60 mL/min (ref >60)
Glucose, Bld: 99 mg/dL (ref 70–99)
Potassium: 4.2 mmol/L (ref 3.5–5.1)
Sodium: 141 mmol/L (ref 135–145)
Total Bilirubin: 0.2 mg/dL — ABNORMAL LOW (ref 0.3–1.2)
Total Protein: 7 g/dL (ref 6.5–8.1)

## 2019-06-27 LAB — CBC WITH DIFFERENTIAL (CANCER CENTER ONLY)
Abs Immature Granulocytes: 0.01 10*3/uL (ref 0.00–0.07)
Basophils Absolute: 0 10*3/uL (ref 0.0–0.1)
Basophils Relative: 1 %
Eosinophils Absolute: 0.4 10*3/uL (ref 0.0–0.5)
Eosinophils Relative: 6 %
HCT: 40.5 % (ref 36.0–46.0)
Hemoglobin: 13 g/dL (ref 12.0–15.0)
Immature Granulocytes: 0 %
Lymphocytes Relative: 35 %
Lymphs Abs: 2.1 10*3/uL (ref 0.7–4.0)
MCH: 29.7 pg (ref 26.0–34.0)
MCHC: 32.1 g/dL (ref 30.0–36.0)
MCV: 92.7 fL (ref 80.0–100.0)
Monocytes Absolute: 0.3 10*3/uL (ref 0.1–1.0)
Monocytes Relative: 6 %
Neutro Abs: 3.1 10*3/uL (ref 1.7–7.7)
Neutrophils Relative %: 52 %
Platelet Count: 232 10*3/uL (ref 150–400)
RBC: 4.37 MIL/uL (ref 3.87–5.11)
RDW: 13.2 % (ref 11.5–15.5)
WBC Count: 5.9 10*3/uL (ref 4.0–10.5)
nRBC: 0 % (ref 0.0–0.2)

## 2019-06-27 MED ORDER — IOHEXOL 300 MG/ML  SOLN
75.0000 mL | Freq: Once | INTRAMUSCULAR | Status: AC | PRN
  Administered 2019-06-27: 11:00:00 75 mL via INTRAVENOUS

## 2019-06-27 MED ORDER — SODIUM CHLORIDE (PF) 0.9 % IJ SOLN
INTRAMUSCULAR | Status: AC
  Filled 2019-06-27: qty 50

## 2019-06-30 ENCOUNTER — Encounter: Payer: Self-pay | Admitting: Internal Medicine

## 2019-06-30 ENCOUNTER — Inpatient Hospital Stay (HOSPITAL_BASED_OUTPATIENT_CLINIC_OR_DEPARTMENT_OTHER): Payer: Medicare HMO | Admitting: Internal Medicine

## 2019-06-30 ENCOUNTER — Other Ambulatory Visit: Payer: Self-pay

## 2019-06-30 VITALS — BP 149/82 | HR 82 | Temp 98.5°F | Resp 18 | Ht 59.0 in | Wt 130.1 lb

## 2019-06-30 DIAGNOSIS — C3490 Malignant neoplasm of unspecified part of unspecified bronchus or lung: Secondary | ICD-10-CM

## 2019-06-30 DIAGNOSIS — R911 Solitary pulmonary nodule: Secondary | ICD-10-CM | POA: Diagnosis not present

## 2019-06-30 DIAGNOSIS — Z85118 Personal history of other malignant neoplasm of bronchus and lung: Secondary | ICD-10-CM | POA: Diagnosis not present

## 2019-06-30 DIAGNOSIS — Z86711 Personal history of pulmonary embolism: Secondary | ICD-10-CM | POA: Diagnosis not present

## 2019-06-30 DIAGNOSIS — F17200 Nicotine dependence, unspecified, uncomplicated: Secondary | ICD-10-CM

## 2019-06-30 DIAGNOSIS — I10 Essential (primary) hypertension: Secondary | ICD-10-CM | POA: Diagnosis not present

## 2019-06-30 DIAGNOSIS — C3492 Malignant neoplasm of unspecified part of left bronchus or lung: Secondary | ICD-10-CM

## 2019-06-30 DIAGNOSIS — R69 Illness, unspecified: Secondary | ICD-10-CM | POA: Diagnosis not present

## 2019-06-30 NOTE — Progress Notes (Signed)
Rachael Villanueva Telephone:([phone]) [phone]   [phone]) [phone]  OFFICE PROGRESS NOTE  Default, Provider, MD No address on file  DIAGNOSIS:  1) Stage IIA (T1a, N1, M0) non-small cell lung cancer, adenocarcinoma presented with right upper lobe small pulmonary nodules as well as metastatic adenocarcinoma to level 12L, diagnosed in February 2019. 2) acute pulmonary embolism incidentally diagnosed on CT scan of the chest on 12/24/2018 involving right lower lobe pulmonary artery segmental branches.  Biomarker Findings Microsatellite status - Cannot Be Determined Tumor Mutational Burden - Cannot Be Determined Genomic Findings For a complete list of the genes assayed, please refer to the Appendix. KRAS G12V 7 Disease relevant genes with no reportable alterations: EGFR, ALK, BRAF, MET, RET, ERBB2, ROS1  PDL1 expression 5%.  PRIOR THERAPY: Status post wedge resection of the left upper lobe nodule with thoracic lingular segmentectomy and mediastinal lymph node sampling under the care of Dr. Roxan Hockey on August 13, 2017.  CURRENT THERAPY:  1) observation for lung cancer. 2) Xarelto 15 mg p.o. twice daily for 3 weeks followed by 20 mg p.o. daily 20 mg p.o. daily.  She started the first dose on 12/25/2018.  INTERVAL HISTORY: Rachael Villanueva 68 y.o. female returns to the clinic today for 6 months follow-up visit.  Her husband was available by phone during the visit.  The patient is feeling fine today with no concerning complaints except for high anxiety about her scan results.  Her blood pressure is elevated today and she does not take any blood pressure medications.  She has been also on Xarelto for history of pulmonary embolism that was diagnosed incidentally in June 2020 and she would like to stop the treatment with Xarelto.  The patient has no chest pain, shortness of breath, cough or hemoptysis.  She denied having any fever or chills.  She has no nausea, vomiting, diarrhea or  constipation.  She had repeat CT scan of the chest performed recently and she is here today for evaluation and discussion of the scan results.  MEDICAL HISTORY: Past Medical History:  Diagnosis Date  . Anxiety   . Depression   . Graves' disease   . Hypercalcemia   . Hypothyroidism    s/p post ablation for Graves Disease  . Lung nodule   . Sleep disorder   . Thyroid disease   . Vitamin D deficiency     ALLERGIES:  has No Known Allergies.  MEDICATIONS:  Current Outpatient Medications  Medication Sig Dispense Refill  . ALPRAZolam (XANAX) 0.5 MG tablet Take 0.25-0.5 mg by mouth at bedtime as needed for anxiety or sleep.     . Armodafinil 250 MG tablet Take 250 mg by mouth daily.    . chlorhexidine (PERIDEX) 0.12 % solution Use as directed 15 mLs in the mouth or throat at bedtime.   98  . ergocalciferol (VITAMIN D2) 50000 units capsule Take 50,000 Units by mouth once a week.    . escitalopram (LEXAPRO) 10 MG tablet Take 10 mg by mouth daily.    . Homeopathic Products (SIMILASAN DRY EYE RELIEF OP) Apply 1 drop to eye daily as needed (dry eyes).    Marland Kitchen levothyroxine (SYNTHROID, LEVOTHROID) 112 MCG tablet Take 112 mcg by mouth daily before breakfast.    . Rivaroxaban 15 & 20 MG TBPK Take as directed on package: Start with one 21m tablet by mouth twice a day with food. On Day 22, switch to one 2103mtablet once a day with food. 51[place]  each 0  . traZODone (DESYREL) 50 MG tablet Take 50-100 mg by mouth at bedtime.   0   No current facility-administered medications for this visit.    SURGICAL HISTORY:  Past Surgical History:  Procedure Laterality Date  . ABLATION     for graves dX  . COLONOSCOPY    . dental implant    . eye compression Bilateral    due to graves disease done at [facility]   . EYE SURGERY    . PARATHYROIDECTOMY    . VIDEO ASSISTED THORACOSCOPY (VATS)/ LOBECTOMY Left 08/13/2017   Procedure: VIDEO ASSISTED THORACOSCOPY (VATS)/ LOBECTOMY;  Surgeon: Melrose Nakayama, MD;   Location: [place];  Service: Thoracic;  Laterality: Left;    REVIEW OF SYSTEMS:  Constitutional: negative Eyes: negative Ears, nose, mouth, throat, and face: negative Respiratory: negative Cardiovascular: negative Gastrointestinal: negative Genitourinary:negative Integument/breast: negative Hematologic/lymphatic: negative Musculoskeletal:positive for arthralgias Neurological: negative Behavioral/Psych: positive for anxiety Endocrine: negative Allergic/Immunologic: negative   PHYSICAL EXAMINATION: General appearance: alert, cooperative and no distress Head: Normocephalic, without obvious abnormality, atraumatic Neck: no adenopathy, no JVD, supple, symmetrical, trachea midline and thyroid not enlarged, symmetric, no tenderness/mass/nodules Lymph nodes: Cervical, supraclavicular, and axillary nodes normal. Resp: clear to auscultation bilaterally Back: symmetric, no curvature. ROM normal. No CVA tenderness. Cardio: regular rate and rhythm, S1, S2 normal, no murmur, click, rub or gallop GI: soft, non-tender; bowel sounds normal; no masses,  no organomegaly Extremities: extremities normal, atraumatic, no cyanosis or edema Neurologic: Alert and oriented X 3, normal strength and tone. Normal symmetric reflexes. Normal coordination and gait  ECOG PERFORMANCE STATUS: 0 - Asymptomatic  Blood pressure (!) 149/82, pulse 82, temperature 98.5 F (36.9 C), temperature source Temporal, resp. rate 18, height '4\' 11"'$  (1.499 m), weight 130 lb 1.6 oz (59 kg), SpO2 98 %.  LABORATORY DATA: Lab Results  Component Value Date   WBC 5.9 06/27/2019   HGB 13.0 06/27/2019   HCT 40.5 06/27/2019   MCV 92.7 06/27/2019   PLT 232 06/27/2019      Chemistry      Component Value Date/Time   NA 141 06/27/2019 0924   K 4.2 06/27/2019 0924   CL 104 06/27/2019 0924   CO2 27 06/27/2019 0924   BUN 20 06/27/2019 0924   CREATININE 0.81 06/27/2019 0924      Component Value Date/Time   CALCIUM 9.0 06/27/2019  0924   ALKPHOS 106 06/27/2019 0924   AST 24 06/27/2019 0924   ALT 25 06/27/2019 0924   BILITOT 0.2 (L) 06/27/2019 0924       RADIOGRAPHIC STUDIES: CT Chest W Contrast  Result Date: 06/27/2019 CLINICAL DATA:  Non-small cell lung cancer, restaging. EXAM: CT CHEST WITH CONTRAST TECHNIQUE: Multidetector CT imaging of the chest was performed during intravenous contrast administration. CONTRAST:  39m OMNIPAQUE IOHEXOL 300 MG/ML  SOLN COMPARISON:  12/24/2018 FINDINGS: Cardiovascular: The heart size appears within normal limits. No pericardial effusion. Aortic atherosclerosis. Mediastinum/Nodes: Status post thyroidectomy. The trachea appears patent and is midline. Insert esophagus small hiatal hernia. No enlarged axillary, supraclavicular, mediastinal, or hilar lymph nodes. Lungs/Pleura: No pleural effusion identified. Status post partial left upper lobectomy. Moderate centrilobular emphysema. Small no lung nodule within the right lower lobe measures 3 mm, image 89/5. New from the previous examination. Upper Abdomen: No acute abnormality. The adrenal glands are unremarkable. Musculoskeletal: No aggressive lytic or sclerotic bone lesions. IMPRESSION: 1. Status post partial left upper lobectomy. No specific findings to suggest local tumor recurrence or metastatic disease. 2. Tiny nodule  in the right lower lobe measures 3 mm. Not seen on the previous exam. This is a nonspecific finding but warrants attention on the follow-up examination. 3. Aortic Atherosclerosis (ICD10-I70.0) and Emphysema (ICD10-J43.9). Electronically Signed   By: Kerby Moors M.D.   On: 06/27/2019 13:45    ASSESSMENT AND PLAN: This is a 68 years old white female recently diagnosed with a stage IIA (T1a, N1, M0) non-small cell lung cancer, adenocarcinoma with no actionable mutations.  She is status post wedge resection of the left upper lobe nodule with lingular segmentectomy and mediastinal lymph node sampling under the care of Dr.  Roxan Hockey on August 13, 2017. The patient declined adjuvant systemic chemotherapy.  The patient is currently on observation and she is feeling fine with no concerning complaints. She had repeat CT scan of the chest that showed no concerning findings for disease recurrence or progression except for 3 mm nodule in the right lung that need close observation on the upcoming imaging studies. I recommended for the patient to continue on observation with repeat CT scan of the chest in 6 months. Regarding the history of pulmonary embolism, the patient would like to discontinue her current treatment with Xarelto.  She completed 6 months of treatment and it would be okay to stop the treatment but she knows that she high risk for developing another DVT or pulmonary embolism in the future because of her cancer diagnosis. I will see the patient back for follow-up visit in 6 months with repeat CT scan of the chest for restaging of her disease. For hypertension she is not currently on any blood pressure medication and its currently elevated because of her anxiety level and concern about the scan results. She was advised to call immediately if she has any concerning symptoms in the interval. The patient voices understanding of current disease status and treatment options and is in agreement with the current care plan.  All questions were answered. The patient knows to call the clinic with any problems, questions or concerns. We can certainly see the patient much sooner if necessary.  Disclaimer: This note was dictated with voice recognition software. Similar sounding words can inadvertently be transcribed and may not be corrected upon review.

## 2019-07-01 ENCOUNTER — Telehealth: Payer: Self-pay | Admitting: Internal Medicine

## 2019-07-01 NOTE — Telephone Encounter (Signed)
Scheduled per los. Called and left msg. Mailed printout  °

## 2019-07-14 DIAGNOSIS — R52 Pain, unspecified: Secondary | ICD-10-CM | POA: Diagnosis not present

## 2019-07-14 DIAGNOSIS — M5030 Other cervical disc degeneration, unspecified cervical region: Secondary | ICD-10-CM | POA: Diagnosis not present

## 2019-07-24 DIAGNOSIS — M542 Cervicalgia: Secondary | ICD-10-CM | POA: Diagnosis not present

## 2019-07-25 ENCOUNTER — Encounter: Payer: Self-pay | Admitting: Internal Medicine

## 2019-07-30 DIAGNOSIS — M542 Cervicalgia: Secondary | ICD-10-CM | POA: Diagnosis not present

## 2019-08-08 ENCOUNTER — Ambulatory Visit: Payer: Medicare HMO

## 2019-08-08 DIAGNOSIS — M542 Cervicalgia: Secondary | ICD-10-CM | POA: Diagnosis not present

## 2019-08-14 ENCOUNTER — Ambulatory Visit: Payer: Medicare HMO | Attending: Internal Medicine

## 2019-08-14 DIAGNOSIS — Z23 Encounter for immunization: Secondary | ICD-10-CM | POA: Insufficient documentation

## 2019-08-14 NOTE — Progress Notes (Signed)
   Covid-19 Vaccination Clinic  Name:  Rachael Villanueva    MRN: [id number] DOB: 12/10/1950  08/14/2019  Rachael Villanueva was observed post Covid-19 immunization for 15 minutes without incidence. She was provided with Vaccine Information Sheet and instruction to access the V-Safe system.   Rachael Villanueva was instructed to call 911 with any severe reactions post vaccine: Marland Kitchen Difficulty breathing  . Swelling of your face and throat  . A fast heartbeat  . A bad rash all over your body  . Dizziness and weakness    Immunizations Administered    Name Date Dose VIS Date Route   Pfizer COVID-19 Vaccine 08/14/2019  4:30 PM 0.3 mL 06/20/2019 Intramuscular   Manufacturer: [place]   Lot: [id number]   [place]: [address]-0

## 2019-08-20 DIAGNOSIS — M542 Cervicalgia: Secondary | ICD-10-CM | POA: Diagnosis not present

## 2019-08-27 DIAGNOSIS — M542 Cervicalgia: Secondary | ICD-10-CM | POA: Diagnosis not present

## 2019-08-29 ENCOUNTER — Ambulatory Visit: Payer: Medicare HMO

## 2019-09-09 ENCOUNTER — Ambulatory Visit: Payer: Medicare HMO | Attending: Internal Medicine

## 2019-09-09 DIAGNOSIS — Z23 Encounter for immunization: Secondary | ICD-10-CM | POA: Insufficient documentation

## 2019-09-09 NOTE — Progress Notes (Signed)
   Covid-19 Vaccination Clinic  Name:  Rachael Villanueva    MRN: [id number] DOB: [birth year]  09/09/2019  [name] was observed post Covid-19 immunization for 15 minutes without incident. She was provided with Vaccine Information Sheet and instruction to access the V-Safe system.   [name] was instructed to call 911 with any severe reactions post vaccine: Marland Kitchen Difficulty breathing  . Swelling of face and throat  . A fast heartbeat  . A bad rash all over body  . Dizziness and weakness   Immunizations Administered    Name Date Dose VIS Date Route   Pfizer COVID-19 Vaccine 09/09/2019 11:07 AM 0.3 mL 06/20/2019 Intramuscular   Manufacturer: [place]   Lot: [id number]   [place]: [address]-5

## 2019-09-19 DIAGNOSIS — J439 Emphysema, unspecified: Secondary | ICD-10-CM | POA: Diagnosis not present

## 2019-09-19 DIAGNOSIS — Z0000 Encounter for general adult medical examination without abnormal findings: Secondary | ICD-10-CM | POA: Diagnosis not present

## 2019-09-19 DIAGNOSIS — Z79899 Other long term (current) drug therapy: Secondary | ICD-10-CM | POA: Diagnosis not present

## 2019-09-19 DIAGNOSIS — E039 Hypothyroidism, unspecified: Secondary | ICD-10-CM | POA: Diagnosis not present

## 2019-09-19 DIAGNOSIS — Z1231 Encounter for screening mammogram for malignant neoplasm of breast: Secondary | ICD-10-CM | POA: Diagnosis not present

## 2019-09-19 DIAGNOSIS — J302 Other seasonal allergic rhinitis: Secondary | ICD-10-CM | POA: Diagnosis not present

## 2019-09-19 DIAGNOSIS — E559 Vitamin D deficiency, unspecified: Secondary | ICD-10-CM | POA: Diagnosis not present

## 2019-09-19 DIAGNOSIS — Z1389 Encounter for screening for other disorder: Secondary | ICD-10-CM | POA: Diagnosis not present

## 2019-09-19 DIAGNOSIS — I480 Paroxysmal atrial fibrillation: Secondary | ICD-10-CM | POA: Diagnosis not present

## 2019-09-19 DIAGNOSIS — C3492 Malignant neoplasm of unspecified part of left bronchus or lung: Secondary | ICD-10-CM | POA: Diagnosis not present

## 2019-09-19 DIAGNOSIS — M47812 Spondylosis without myelopathy or radiculopathy, cervical region: Secondary | ICD-10-CM | POA: Diagnosis not present

## 2019-09-19 DIAGNOSIS — R69 Illness, unspecified: Secondary | ICD-10-CM | POA: Diagnosis not present

## 2019-10-21 DIAGNOSIS — Z23 Encounter for immunization: Secondary | ICD-10-CM | POA: Diagnosis not present

## 2019-10-21 DIAGNOSIS — R7989 Other specified abnormal findings of blood chemistry: Secondary | ICD-10-CM | POA: Diagnosis not present

## 2019-10-21 DIAGNOSIS — R946 Abnormal results of thyroid function studies: Secondary | ICD-10-CM | POA: Diagnosis not present

## 2019-11-18 ENCOUNTER — Other Ambulatory Visit: Payer: Self-pay

## 2019-11-18 ENCOUNTER — Ambulatory Visit (INDEPENDENT_AMBULATORY_CARE_PROVIDER_SITE_OTHER): Payer: Medicare HMO | Admitting: Internal Medicine

## 2019-11-18 ENCOUNTER — Encounter: Payer: Self-pay | Admitting: Internal Medicine

## 2019-11-18 VITALS — BP 144/78 | HR 100 | Temp 98.0°F | Ht 59.0 in

## 2019-11-18 DIAGNOSIS — E890 Postprocedural hypothyroidism: Secondary | ICD-10-CM | POA: Diagnosis not present

## 2019-11-18 LAB — TSH: TSH: 0.99 u[IU]/mL (ref 0.35–4.50)

## 2019-11-18 LAB — T4, FREE: Free T4: 1.33 ng/dL (ref 0.60–1.60)

## 2019-11-18 NOTE — Progress Notes (Signed)
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 is a 69 y.o. female with a past medical history of Anxiety, depression Hx of lung ca , Hx of PAF 07/2017. The patient presented for initial endocrinology clinic visit on 11/19/2019 for consultative assistance with her postablative hypothyroidism  She is S/P RAI ablation secondary to Graves' Disease in 1996 . She has been on LT-4 replacement    She is S/p parathyroidectomy in 1998 due to hypercalcemia.   She is on Levothyroxine 125 mcg ~ a month    She noted local neck enlargement that she attributes to salivary gland Has gained weight with fatigue  Denies constipation   Has depression which seems to be a chronic issue  She does not sleep well at night - takes sleeping pills   No biotin intake    Mother and brother with thyroid disease    HISTORY:  Past Medical History:  Past Medical History:  Diagnosis Date  . Anxiety   . Depression   . Graves' disease   . Hypercalcemia   . Hypothyroidism    s/p post ablation for Graves Disease  . Lung nodule   . Sleep disorder   . Thyroid disease   . Vitamin D deficiency    Past Surgical History:  Past Surgical History:  Procedure Laterality Date  . ABLATION     for graves dX  . COLONOSCOPY    . dental implant    . eye compression Bilateral    due to graves disease done at [facility]   . EYE SURGERY    . PARATHYROIDECTOMY    . VIDEO ASSISTED THORACOSCOPY (VATS)/ LOBECTOMY Left 08/13/2017   Procedure: VIDEO ASSISTED THORACOSCOPY (VATS)/ LOBECTOMY;  Surgeon: Melrose Nakayama, MD;  Location: [place];  Service: Thoracic;  Laterality: Left;      Social History:  reports that she quit smoking about 2 years ago. Her smoking use included cigarettes. She smoked 0.50  packs per day. She has never used smokeless tobacco. She reports that she does not drink alcohol or use drugs.  Family History: family history includes Lung cancer in her mother; Parkinson's disease in her father; Thyroid disease in her brother and mother.   HOME MEDICATIONS: Allergies as of 11/18/2019   No Known Allergies     Medication List       Accurate as of Nov 18, 2019 11:59 PM. If you have any questions, ask your nurse or doctor.        ALPRAZolam 0.5 MG tablet Commonly known as: XANAX Take 0.25-0.5 mg by mouth at bedtime as needed for anxiety or sleep.   Armodafinil 250 MG tablet Take 250 mg by mouth daily.   chlorhexidine 0.12 % solution Commonly known as: PERIDEX Use as directed 15 mLs in the mouth or throat at bedtime.   ergocalciferol 1.25 MG (50000 UT) capsule Commonly known as: VITAMIN D2 Take 50,000 Units by mouth once a week.   escitalopram 10 MG tablet Commonly known as: LEXAPRO Take 20 mg by mouth daily.   levothyroxine 125 MCG tablet Commonly known as: SYNTHROID Take 125 mcg by mouth daily before breakfast. What changed: Another medication with the same name was removed. Continue taking this medication, and follow the directions you  see here. Changed by: Dorita Sciara, MD   Rivaroxaban Stater Pack (15 mg and 20 mg) Commonly known as: XARELTO STARTER PACK Take as directed on package: Start with one 15mg  tablet by mouth twice a day with food. On Day 22, switch to one 20mg  tablet once a day with food.   SIMILASAN DRY EYE RELIEF OP Apply 1 drop to eye daily as needed (dry eyes).   traZODone 50 MG tablet Commonly known as: DESYREL Take 50-100 mg by mouth at bedtime.         REVIEW OF SYSTEMS: A comprehensive ROS was conducted with the patient and is negative except as per HPI    OBJECTIVE:  VS: BP (!) 144/78 (BP Location: Left Arm, Patient Position: Sitting, Cuff Size: Normal)   Pulse 100   Temp 98 F (36.7 C)   Ht 4\' 11"  (1.499  m)   SpO2 98%   BMI 26.28 kg/m    Wt Readings from Last 3 Encounters:  06/30/19 130 lb 1.6 oz (59 kg)  12/26/18 130 lb 9.6 oz (59.2 kg)  06/26/18 126 lb 3.2 oz (57.2 kg)     EXAM: General: Pt appears well and is in NAD  Neck: General: Supple without adenopathy. Thyroid: Thyroid size normal.  No goiter or nodules appreciated.  Lungs: Clear with good BS bilat with no rales, rhonchi, or wheezes  Heart: Auscultation: RRR.  Abdomen: Normoactive bowel sounds, soft, nontender, without masses or organomegaly palpable  Extremities:  BL LE: No pretibial edema normal ROM and strength.  Mental Status: Judgment, insight: Intact Orientation: Oriented to time, place, and person Mood and affect: No depression, anxiety, or agitation     DATA REVIEWED: 09/19/2019 TSH 8.03 uIU/mL    Results for LAVANNA, ROG (MRN [id number]) as of 11/19/2019 07:40  Ref. Range 11/18/2019 13:44  TSH Latest Ref Range: 0.35 - 4.50 uIU/mL 0.99  T4,Free(Direct) Latest Ref Range: 0.60 - 1.60 ng/dL 1.33     ASSESSMENT/PLAN/RECOMMENDATIONS:   1. Postablative Hypothyroidism:  - Pt with multiple non-specific symptoms that she attributes to her thyroid disease - Pt educated extensively on the correct way to take levothyroxine (first thing in the morning with water, 30 minutes before eating or taking other medications). - Pt encouraged to double dose the following day if she were to miss a dose given long half-life of levothyroxine.  - Repeat TFT's are normal at this time, will recheck in 6 weeks   Medications : Levothyroxine 125 mcg daily    F/U in 3 months Labs in 6 weeks   Signed electronically by: Mack Guise, MD  [facility]  [address] [place], [place] [address] Phone: [phone] FAX: [phone]   CC: Default, Provider, MD No address on file Phone: None Fax: None   Return to Endocrinology clinic as below: Future Appointments   Date Time Provider [place]  12/26/2019  9:00 AM [facility] LAB 6 [facility] None  12/26/2019 10:00 AM [facility]-CT 2 [facility]-CT [place]  12/29/2019  9:00 AM Curt Bears, MD [facility] None  12/29/2019  2:00 PM [facility] LAB [facility] None  03/05/2020  1:40 PM Titan Karner, Melanie Crazier, MD [facility] None

## 2019-11-18 NOTE — Patient Instructions (Signed)

## 2019-12-05 ENCOUNTER — Telehealth: Payer: Self-pay | Admitting: Medical Oncology

## 2019-12-05 ENCOUNTER — Telehealth: Payer: Self-pay | Admitting: Internal Medicine

## 2019-12-05 ENCOUNTER — Encounter: Payer: Self-pay | Admitting: Internal Medicine

## 2019-12-05 NOTE — Telephone Encounter (Signed)
Ok for pt to have labs done earlier?

## 2019-12-05 NOTE — Telephone Encounter (Signed)
Patient is having her labs done a bit earlier because she will be leaving [facility] for 3 months around June 21st.  After her labs are resulted she is requesting that her Thyroid medication be called into the [facility] on [address] for a 90 day supply.  Clarification - call [phone]

## 2019-12-05 NOTE — Telephone Encounter (Signed)
appts made for sept.

## 2019-12-15 ENCOUNTER — Other Ambulatory Visit (INDEPENDENT_AMBULATORY_CARE_PROVIDER_SITE_OTHER): Payer: Medicare HMO

## 2019-12-15 ENCOUNTER — Other Ambulatory Visit: Payer: Self-pay

## 2019-12-15 DIAGNOSIS — E890 Postprocedural hypothyroidism: Secondary | ICD-10-CM

## 2019-12-15 LAB — T4, FREE: Free T4: 1.5 ng/dL (ref 0.60–1.60)

## 2019-12-15 LAB — TSH: TSH: 1.3 u[IU]/mL (ref 0.35–4.50)

## 2019-12-26 ENCOUNTER — Other Ambulatory Visit: Payer: Medicare HMO

## 2019-12-26 ENCOUNTER — Ambulatory Visit (HOSPITAL_COMMUNITY): Payer: Medicare HMO

## 2019-12-29 ENCOUNTER — Ambulatory Visit: Payer: Medicare HMO | Admitting: Internal Medicine

## 2019-12-29 ENCOUNTER — Other Ambulatory Visit: Payer: Medicare HMO

## 2020-03-05 ENCOUNTER — Encounter: Payer: Self-pay | Admitting: Internal Medicine

## 2020-03-05 ENCOUNTER — Other Ambulatory Visit: Payer: Self-pay

## 2020-03-05 ENCOUNTER — Ambulatory Visit (INDEPENDENT_AMBULATORY_CARE_PROVIDER_SITE_OTHER): Payer: Medicare HMO | Admitting: Internal Medicine

## 2020-03-05 VITALS — BP 132/80 | HR 91 | Ht 59.0 in

## 2020-03-05 DIAGNOSIS — E890 Postprocedural hypothyroidism: Secondary | ICD-10-CM

## 2020-03-05 NOTE — Progress Notes (Signed)
Name: Rachael Villanueva  MRN/ DOB: [id number], [birth year]    Age/ Sex: 69 y.o., female     PCP: Patient, No Pcp Per   Reason for Endocrinology Evaluation: Postablative hypothyroidism         Initial Endocrinology Clinic Visit: 11/19/2019    PATIENT IDENTIFIER: Rachael Villanueva is a 69 y.o., female with a past medical history of Anxiety, depression Hx of lung ca , Hx of PAF 07/2017. She has followed with [place] [facility] since 11/19/2019 for consultative assistance with management of her hypothyroidism     HISTORICAL SUMMARY:   She is S/P RAI ablation secondary to Graves' Disease in 1996 . She has been on LT-4 replacement    She is S/p parathyroidectomy in 1998 due to hypercalcemia.   Mother and brother with thyroid disease  SUBJECTIVE:     Today (03/05/2020):  Rachael Villanueva is here for postablative hypothyroidism  Weight has been stable  Denies constipation  Denies depression and anxiety        HISTORY:  Past Medical History:  Past Medical History:  Diagnosis Date  . Anxiety   . Depression   . Graves' disease   . Hypercalcemia   . Hypothyroidism    s/p post ablation for Graves Disease  . Lung nodule   . Sleep disorder   . Thyroid disease   . Vitamin D deficiency     Past Surgical History:  Past Surgical History:  Procedure Laterality Date  . ABLATION     for graves dX  . COLONOSCOPY    . dental implant    . eye compression Bilateral    due to graves disease done at [facility]   . EYE SURGERY    . PARATHYROIDECTOMY    . VIDEO ASSISTED THORACOSCOPY (VATS)/ LOBECTOMY Left 08/13/2017   Procedure: VIDEO ASSISTED THORACOSCOPY (VATS)/ LOBECTOMY;  Surgeon: Melrose Nakayama, MD;  Location: [place];  Service: Thoracic;  Laterality: Left;     Social History:  reports that she quit smoking about 2 years ago. Her smoking use included cigarettes. She smoked 0.50 packs per day. She has never used smokeless tobacco. She reports that she does not drink  alcohol and does not use drugs. Family History:  Family History  Problem Relation Age of Onset  . Thyroid disease Mother   . Lung cancer Mother   . Parkinson's disease Father   . Thyroid disease Brother       HOME MEDICATIONS: Allergies as of 03/05/2020   No Known Allergies     Medication List       Accurate as of March 05, 2020  1:11 PM. If you have any questions, ask your nurse or doctor.        ALPRAZolam 0.5 MG tablet Commonly known as: XANAX Take 0.25-0.5 mg by mouth at bedtime as needed for anxiety or sleep.   Armodafinil 250 MG tablet Take 250 mg by mouth daily.   chlorhexidine 0.12 % solution Commonly known as: PERIDEX Use as directed 15 mLs in the mouth or throat at bedtime.   ergocalciferol 1.25 MG (50000 UT) capsule Commonly known as: VITAMIN D2 Take 50,000 Units by mouth once a week.   escitalopram 10 MG tablet Commonly known as: LEXAPRO Take 20 mg by mouth daily.   levothyroxine 125 MCG tablet Commonly known as: SYNTHROID Take 125 mcg by mouth daily before breakfast.   Rivaroxaban Stater Pack (15 mg and 20 mg) Commonly known as: XARELTO STARTER PACK Take as directed  on package: Start with one 15mg  tablet by mouth twice a day with food. On Day 22, switch to one 20mg  tablet once a day with food.   SIMILASAN DRY EYE RELIEF OP Apply 1 drop to eye daily as needed (dry eyes).   traZODone 50 MG tablet Commonly known as: DESYREL Take 50-100 mg by mouth at bedtime.         OBJECTIVE:   PHYSICAL EXAM: VS: BP 132/80 (BP Location: Left Arm, Patient Position: Sitting, Cuff Size: Normal)   Pulse 91   Ht 4\' 11"  (1.499 m)   SpO2 97%   BMI 26.28 kg/m    EXAM: General: Pt appears well and is in NAD  Neck: General: Supple without adenopathy. Thyroid: No goiter or nodules appreciated.   Lungs: Clear with good BS bilat with no rales, rhonchi, or wheezes  Heart: Auscultation: RRR.  Abdomen: Normoactive bowel sounds, soft, nontender, without  masses or organomegaly palpable  Extremities:  BL LE: No pretibial edema normal ROM and strength.  Mental Status: Judgment, insight: Intact Orientation: Oriented to time, place, and person Mood and affect: No depression, anxiety, or agitation     DATA REVIEWED:  Results for CHRISSY, EALEY (MRN [id number]) as of 03/07/2020 14:16  Ref. Range 12/15/2019 12:08  TSH Latest Ref Range: 0.35 - 4.50 uIU/mL 1.30  T4,Free(Direct) Latest Ref Range: 0.60 - 1.60 ng/dL 1.50     ASSESSMENT / PLAN / RECOMMENDATIONS:   1. Postablative Hypothyroidism:  - Pt is clinically and biochemically euthyroid  - TFT's have been normal on current dose of LT- 4 replacement.  - Unable to perform labs today, pt will stop by another day.     Medications  Levothyroxine 125 mcg daily   F/U in 6 months  Signed electronically by: Mack Guise, MD  [facility]  [address] [place], [place] [address] Phone: [phone] FAX: [phone]      CC: Patient, No Pcp Per No address on file Phone: None  Fax: None   Return to Endocrinology clinic as below: Future Appointments  Date Time Provider [place]  03/05/2020  1:40 PM Rael Tilly, Melanie Crazier, MD [facility] None  03/16/2020  9:45 AM [facility] LAB [facility] None  03/16/2020 11:00 AM [facility]-CT 2 [facility]-CT [place]  03/18/2020  9:00 AM Curt Bears, MD [facility] None

## 2020-03-05 NOTE — Patient Instructions (Signed)
Continue Levothyroxine 125 mcg daily

## 2020-03-10 ENCOUNTER — Other Ambulatory Visit (INDEPENDENT_AMBULATORY_CARE_PROVIDER_SITE_OTHER): Payer: Medicare HMO

## 2020-03-10 ENCOUNTER — Other Ambulatory Visit: Payer: Self-pay

## 2020-03-10 DIAGNOSIS — E890 Postprocedural hypothyroidism: Secondary | ICD-10-CM | POA: Diagnosis not present

## 2020-03-10 LAB — T4, FREE: Free T4: 1.29 ng/dL (ref 0.60–1.60)

## 2020-03-10 LAB — TSH: TSH: 0.21 u[IU]/mL — ABNORMAL LOW (ref 0.35–4.50)

## 2020-03-16 ENCOUNTER — Telehealth: Payer: Self-pay | Admitting: Medical Oncology

## 2020-03-16 ENCOUNTER — Other Ambulatory Visit: Payer: Medicare HMO

## 2020-03-16 ENCOUNTER — Inpatient Hospital Stay: Payer: Medicare HMO | Attending: Internal Medicine

## 2020-03-16 ENCOUNTER — Encounter (HOSPITAL_COMMUNITY): Payer: Self-pay

## 2020-03-16 ENCOUNTER — Other Ambulatory Visit: Payer: Self-pay

## 2020-03-16 ENCOUNTER — Ambulatory Visit (HOSPITAL_COMMUNITY)
Admission: RE | Admit: 2020-03-16 | Discharge: 2020-03-16 | Disposition: A | Discharge status: Home / Self Care | Payer: Medicare HMO | Source: Ambulatory Visit | Attending: Internal Medicine | Admitting: Internal Medicine

## 2020-03-16 DIAGNOSIS — R05 Cough: Secondary | ICD-10-CM | POA: Insufficient documentation

## 2020-03-16 DIAGNOSIS — F419 Anxiety disorder, unspecified: Secondary | ICD-10-CM | POA: Diagnosis not present

## 2020-03-16 DIAGNOSIS — Z905 Acquired absence of kidney: Secondary | ICD-10-CM | POA: Diagnosis not present

## 2020-03-16 DIAGNOSIS — K449 Diaphragmatic hernia without obstruction or gangrene: Secondary | ICD-10-CM | POA: Diagnosis not present

## 2020-03-16 DIAGNOSIS — R918 Other nonspecific abnormal finding of lung field: Secondary | ICD-10-CM | POA: Diagnosis not present

## 2020-03-16 DIAGNOSIS — C3490 Malignant neoplasm of unspecified part of unspecified bronchus or lung: Secondary | ICD-10-CM | POA: Insufficient documentation

## 2020-03-16 DIAGNOSIS — H524 Presbyopia: Secondary | ICD-10-CM | POA: Diagnosis not present

## 2020-03-16 DIAGNOSIS — R69 Illness, unspecified: Secondary | ICD-10-CM | POA: Diagnosis not present

## 2020-03-16 DIAGNOSIS — Z85118 Personal history of other malignant neoplasm of bronchus and lung: Secondary | ICD-10-CM | POA: Insufficient documentation

## 2020-03-16 DIAGNOSIS — R911 Solitary pulmonary nodule: Secondary | ICD-10-CM | POA: Diagnosis not present

## 2020-03-16 LAB — CBC WITH DIFFERENTIAL (CANCER CENTER ONLY)
Abs Immature Granulocytes: 0.02 10*3/uL (ref 0.00–0.07)
Basophils Absolute: 0 10*3/uL (ref 0.0–0.1)
Basophils Relative: 0 %
Eosinophils Absolute: 0.3 10*3/uL (ref 0.0–0.5)
Eosinophils Relative: 4 %
HCT: 39.2 % (ref 36.0–46.0)
Hemoglobin: 12.5 g/dL (ref 12.0–15.0)
Immature Granulocytes: 0 %
Lymphocytes Relative: 30 %
Lymphs Abs: 2.1 10*3/uL (ref 0.7–4.0)
MCH: 27.2 pg (ref 26.0–34.0)
MCHC: 31.9 g/dL (ref 30.0–36.0)
MCV: 85.4 fL (ref 80.0–100.0)
Monocytes Absolute: 0.4 10*3/uL (ref 0.1–1.0)
Monocytes Relative: 5 %
Neutro Abs: 4.3 10*3/uL (ref 1.7–7.7)
Neutrophils Relative %: 61 %
Platelet Count: 225 10*3/uL (ref 150–400)
RBC: 4.59 MIL/uL (ref 3.87–5.11)
RDW: 13.7 % (ref 11.5–15.5)
WBC Count: 7.1 10*3/uL (ref 4.0–10.5)
nRBC: 0 % (ref 0.0–0.2)

## 2020-03-16 LAB — CMP (CANCER CENTER ONLY)
ALT: 19 U/L (ref 0–44)
AST: 16 U/L (ref 15–41)
Albumin: 3.7 g/dL (ref 3.5–5.0)
Alkaline Phosphatase: 114 U/L (ref 38–126)
Anion gap: 6 (ref 5–15)
BUN: 19 mg/dL (ref 8–23)
CO2: 28 mmol/L (ref 22–32)
Calcium: 9.2 mg/dL (ref 8.9–10.3)
Chloride: 106 mmol/L (ref 98–111)
Creatinine: 0.74 mg/dL (ref 0.44–1.00)
GFR, Est AFR Am: >60 mL/min (ref >60)
GFR, Estimated: >60 mL/min (ref >60)
Glucose, Bld: 97 mg/dL (ref 70–99)
Potassium: 3.8 mmol/L (ref 3.5–5.1)
Sodium: 140 mmol/L (ref 135–145)
Total Bilirubin: <0.2 mg/dL — ABNORMAL LOW (ref 0.3–1.2)
Total Protein: 6.8 g/dL (ref 6.5–8.1)

## 2020-03-16 MED ORDER — IOHEXOL 300 MG/ML  SOLN
75.0000 mL | Freq: Once | INTRAMUSCULAR | Status: AC | PRN
  Administered 2020-03-16: 75 mL via INTRAVENOUS

## 2020-03-16 NOTE — Telephone Encounter (Signed)
Called report Ct Chest "IMPRESSION: 1. 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 2. Postsurgical change in LEFT upper lobe without evidence of local recurrence. 3. No lymphadenopathy." appt tomorrow

## 2020-03-18 ENCOUNTER — Encounter: Payer: Self-pay | Admitting: Internal Medicine

## 2020-03-18 ENCOUNTER — Inpatient Hospital Stay (HOSPITAL_BASED_OUTPATIENT_CLINIC_OR_DEPARTMENT_OTHER): Payer: Medicare HMO | Admitting: Internal Medicine

## 2020-03-18 ENCOUNTER — Other Ambulatory Visit: Payer: Self-pay

## 2020-03-18 VITALS — BP 155/86 | HR 77 | Temp 97.0°F | Resp 20 | Ht 59.0 in | Wt 129.2 lb

## 2020-03-18 DIAGNOSIS — C3492 Malignant neoplasm of unspecified part of left bronchus or lung: Secondary | ICD-10-CM | POA: Diagnosis not present

## 2020-03-18 DIAGNOSIS — C3490 Malignant neoplasm of unspecified part of unspecified bronchus or lung: Secondary | ICD-10-CM

## 2020-03-18 DIAGNOSIS — R918 Other nonspecific abnormal finding of lung field: Secondary | ICD-10-CM | POA: Diagnosis not present

## 2020-03-18 NOTE — Progress Notes (Signed)
[place] Telephone:(336) [phone]   Fax:(336) [phone]  OFFICE PROGRESS NOTE  Patient, No Pcp Per No address on file  DIAGNOSIS:  1) Stage IIA (T1a, N1, M0) non-small cell lung cancer, adenocarcinoma presented with right upper lobe small pulmonary nodules as well as metastatic adenocarcinoma to level 12L, diagnosed in February 2019. 2) acute pulmonary embolism incidentally diagnosed on CT scan of the chest on 12/24/2018 involving right lower lobe pulmonary artery segmental branches.  Biomarker Findings Microsatellite status - Cannot Be Determined Tumor Mutational Burden - Cannot Be Determined Genomic Findings For a complete list of the genes assayed, please refer to the Appendix. KRAS G12V 7 Disease relevant genes with no reportable alterations: EGFR, ALK, BRAF, MET, RET, ERBB2, ROS1  PDL1 expression 5%.  PRIOR THERAPY: Status post wedge resection of the left upper lobe nodule with thoracic lingular segmentectomy and mediastinal lymph node sampling under the care of Dr. Roxan Hockey on August 13, 2017.  CURRENT THERAPY:  1) observation for lung cancer. 2) Xarelto 15 mg p.o. twice daily for 3 weeks followed by 20 mg p.o. daily 20 mg p.o. daily.  She started the first dose on 12/25/2018.  INTERVAL HISTORY: Rachael Villanueva 69 y.o. female returns to the clinic today for follow-up visit accompanied by her husband.  The patient is feeling fine today with no concerning complaints except for dry cough and anxiety about the scan results that she read online.  She denied having any chest pain, shortness of breath or hemoptysis.  She denied having any recent weight loss or night sweats.  She has no nausea, vomiting, diarrhea or constipation.  She has no headache or visual changes.  She was supposed to have repeat CT scan of the chest in May 2021 but the patient was out of town in the [place] area for 3 months and she delayed her imaging studies and lab work.  She is here  today for evaluation and discussion of her recent CT scan results.  MEDICAL HISTORY: Past Medical History:  Diagnosis Date  . Anxiety   . Depression   . Graves' disease   . Hypercalcemia   . Hypothyroidism    s/p post ablation for Graves Disease  . Lung nodule   . Sleep disorder   . Thyroid disease   . Vitamin D deficiency     ALLERGIES:  has No Known Allergies.  MEDICATIONS:  Current Outpatient Medications  Medication Sig Dispense Refill  . ALPRAZolam (XANAX) 0.5 MG tablet Take 0.25-0.5 mg by mouth at bedtime as needed for anxiety or sleep.     . Armodafinil 250 MG tablet Take 250 mg by mouth daily.    . chlorhexidine (PERIDEX) 0.12 % solution Use as directed 15 mLs in the mouth or throat at bedtime.  (Patient not taking: Reported on 03/05/2020)  98  . ergocalciferol (VITAMIN D2) 50000 units capsule Take 50,000 Units by mouth once a week.    . escitalopram (LEXAPRO) 10 MG tablet Take 20 mg by mouth daily.     . Homeopathic Products (SIMILASAN DRY EYE RELIEF OP) Apply 1 drop to eye daily as needed (dry eyes). (Patient not taking: Reported on 03/05/2020)    . levothyroxine (SYNTHROID) 125 MCG tablet Take 125 mcg by mouth as directed. Half a tablet on sundays and 1 tablet rest of the week     . Rivaroxaban 15 & 20 MG TBPK Take as directed on package: Start with one 67m tablet by mouth twice  a day with food. On Day 22, switch to one 19m tablet once a day with food. (Patient not taking: Reported on 03/05/2020) 51 each 0  . traZODone (DESYREL) 50 MG tablet Take 50-100 mg by mouth at bedtime.   0   No current facility-administered medications for this visit.    SURGICAL HISTORY:  Past Surgical History:  Procedure Laterality Date  . ABLATION     for graves dX  . COLONOSCOPY    . dental implant    . eye compression Bilateral    due to graves disease [facility]  . EYE SURGERY    . PARATHYROIDECTOMY    . VIDEO ASSISTED THORACOSCOPY (VATS)/ LOBECTOMY Left 08/13/2017   Procedure:  VIDEO ASSISTED THORACOSCOPY (VATS)/ LOBECTOMY;  Surgeon: HMelrose Nakayama MD;  Location: [facility] OR;  Service: Thoracic;  Laterality: Left;    REVIEW OF SYSTEMS:  A comprehensive review of systems was negative except for: Respiratory: positive for cough and dyspnea on exertion   PHYSICAL EXAMINATION: General appearance: alert, cooperative and no distress Head: Normocephalic, without obvious abnormality, atraumatic Neck: no adenopathy, no JVD, supple, symmetrical, trachea midline and thyroid not enlarged, symmetric, no tenderness/mass/nodules Lymph nodes: Cervical, supraclavicular, and axillary nodes normal. Resp: clear to auscultation bilaterally Back: symmetric, no curvature. ROM normal. No CVA tenderness. Cardio: regular rate and rhythm, S1, S2 normal, no murmur, click, rub or gallop GI: soft, non-tender; bowel sounds normal; no masses,  no organomegaly Extremities: extremities normal, atraumatic, no cyanosis or edema  ECOG PERFORMANCE STATUS: 1 - Symptomatic but completely ambulatory  Blood pressure (!) 155/86, pulse 77, temperature (!) 97 F (36.1 C), temperature source Tympanic, resp. rate 20, height 4' 11" (1.499 m), weight 129 lb 3.2 oz (58.6 kg), SpO2 99 %.  LABORATORY DATA: Lab Results  Component Value Date   WBC 7.1 03/16/2020   HGB 12.5 03/16/2020   HCT 39.2 03/16/2020   MCV 85.4 03/16/2020   PLT 225 03/16/2020      Chemistry      Component Value Date/Time   NA 140 03/16/2020 0958   K 3.8 03/16/2020 0958   CL 106 03/16/2020 0958   CO2 28 03/16/2020 0958   BUN 19 03/16/2020 0958   CREATININE 0.74 03/16/2020 0958      Component Value Date/Time   CALCIUM 9.2 03/16/2020 0958   ALKPHOS 114 03/16/2020 0958   AST 16 03/16/2020 0958   ALT 19 03/16/2020 0958   BILITOT <0.2 (L) 03/16/2020 0958       RADIOGRAPHIC STUDIES: CT Chest W Contrast  Result Date: 03/16/2020 CLINICAL DATA:  Primary Cancer Type: Lung Imaging Indication: Routine surveillance Interval therapy  since last imaging? No Initial Cancer Diagnosis Date: 08/13/2017; Established by: Biopsy-proven Detailed Pathology: Stage IIA non-small cell lung cancer, adenocarcinoma. Primary Tumor location: Left upper lobe. Surgeries: Wedge resection of left upper lobe nodule, thoracoscopic lingular segmentectomy 08/13/2017. Thyroid ablation, parathyroidectomy. Chemotherapy: No Immunotherapy? No Radiation therapy? No EXAM: CT CHEST WITH CONTRAST TECHNIQUE: Multidetector CT imaging of the chest was performed during intravenous contrast administration. CONTRAST:  736mOMNIPAQUE IOHEXOL 300 MG/ML  SOLN COMPARISON:  Most recent CT chest 06/27/2019.  06/26/2017 PET-CT. FINDINGS: Cardiovascular: No significant vascular findings. Normal heart size. No pericardial effusion. Mediastinum/Nodes: No axillary or supraclavicular adenopathy. No mediastinal adenopathy. No pericardial effusion. Small hiatal hernia. Lungs/Pleura: There are new bilateral small round pulmonary nodules of varying size in a pattern consistent with metastatic pulmonary nodules. Nodules less than 10 mm each. Example nodule in the posterior  RIGHT lower lobe against the pleural surface measures 6 mm (image 102/8). LEFT lobe nodule measuring 7 mm image 104. Three adjacent LEFT lobe nodules measuring 5 mm each on image 75 there 73. RIGHT upper lobe nodule measures 4 mm on image 35/8. Approximately 20 nodules per lung. Postsurgical change in the LEFT upper lobe without evidence of local recurrence. Stable thickening along the resection scar. Upper Abdomen: Limited view of the liver, kidneys, pancreas are unremarkable. Normal adrenal glands. Musculoskeletal: No aggressive osseous lesion. IMPRESSION: 1. 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 2. Postsurgical change in LEFT upper lobe without evidence of local recurrence. 3. No lymphadenopathy. These results will be called to the ordering clinician or representative  by the Radiologist Assistant, and communication documented in the PACS or [employer]. Electronically Signed   By: Suzy Bouchard M.D.   On: 03/16/2020 11:55    ASSESSMENT AND PLAN: This is a 69 years old white female recently diagnosed with a stage IIA (T1a, N1, M0) non-small cell lung cancer, adenocarcinoma with no actionable mutations.  She is status post wedge resection of the left upper lobe nodule with lingular segmentectomy and mediastinal lymph node sampling under the care of Dr. Roxan Hockey on August 13, 2017. The patient declined adjuvant systemic chemotherapy.  The patient is here today for evaluation after repeating CT scan of the chest for restaging of her disease. I personally and independently reviewed the scan images and discussed the results with the patient and her husband. Her scan showed multiple bilateral pulmonary nodules suspicious for pulmonary nodule or metastasis. I recommended for the patient to have a PET scan for further evaluation of this lesion and to choose the right spot for rebiopsy. I will arrange for the patient to see Dr. Roxan Hockey for consideration of rebiopsy with navigational bronchoscopy if CT-guided core biopsy is not an option. I will arrange for the patient a follow-up visit with me after the biopsy and the PET scan. She was advised to call immediately if she has any concerning symptoms in the interval. The patient voices understanding of current disease status and treatment options and is in agreement with the current care plan.  All questions were answered. The patient knows to call the clinic with any problems, questions or concerns. We can certainly see the patient much sooner if necessary.  Disclaimer: This note was dictated with voice recognition software. Similar sounding words can inadvertently be transcribed and may not be corrected upon review.

## 2020-03-22 ENCOUNTER — Encounter: Payer: Self-pay | Admitting: *Deleted

## 2020-03-22 NOTE — Progress Notes (Signed)
Dr. Julien Nordmann updated me that Rachael Villanueva needs PET scan and to see thoracic surgery.  I coordinated with thoracic surgery office to update.  Rachael Villanueva is scheduled for her PET and to see Dr. Roxan Hockey.

## 2020-03-25 ENCOUNTER — Other Ambulatory Visit: Payer: Self-pay

## 2020-03-25 ENCOUNTER — Ambulatory Visit (HOSPITAL_COMMUNITY)
Admission: RE | Admit: 2020-03-25 | Discharge: 2020-03-25 | Disposition: A | Discharge status: Home / Self Care | Payer: Medicare HMO | Source: Ambulatory Visit | Attending: Internal Medicine | Admitting: Internal Medicine

## 2020-03-25 DIAGNOSIS — I700 Atherosclerosis of aorta: Secondary | ICD-10-CM | POA: Diagnosis not present

## 2020-03-25 DIAGNOSIS — J439 Emphysema, unspecified: Secondary | ICD-10-CM | POA: Diagnosis not present

## 2020-03-25 DIAGNOSIS — I2510 Atherosclerotic heart disease of native coronary artery without angina pectoris: Secondary | ICD-10-CM | POA: Insufficient documentation

## 2020-03-25 DIAGNOSIS — C3490 Malignant neoplasm of unspecified part of unspecified bronchus or lung: Secondary | ICD-10-CM | POA: Insufficient documentation

## 2020-03-25 DIAGNOSIS — J320 Chronic maxillary sinusitis: Secondary | ICD-10-CM | POA: Diagnosis not present

## 2020-03-25 LAB — GLUCOSE, CAPILLARY: Glucose-Capillary: 102 mg/dL — ABNORMAL HIGH (ref 70–99)

## 2020-03-25 MED ORDER — FLUDEOXYGLUCOSE F - 18 (FDG) INJECTION
6.5000 | Freq: Once | INTRAVENOUS | Status: AC | PRN
  Administered 2020-03-25: 6.5 via INTRAVENOUS

## 2020-03-30 ENCOUNTER — Other Ambulatory Visit: Payer: Self-pay

## 2020-03-30 ENCOUNTER — Institutional Professional Consult (permissible substitution): Payer: Medicare HMO | Admitting: Thoracic Surgery (Cardiothoracic Vascular Surgery)

## 2020-03-30 ENCOUNTER — Encounter: Payer: Self-pay | Admitting: Thoracic Surgery (Cardiothoracic Vascular Surgery)

## 2020-03-30 ENCOUNTER — Other Ambulatory Visit: Payer: Self-pay | Admitting: *Deleted

## 2020-03-30 VITALS — BP 147/90 | HR 78 | Resp 20 | Ht 60.0 in | Wt 127.0 lb

## 2020-03-30 DIAGNOSIS — Z902 Acquired absence of lung [part of]: Secondary | ICD-10-CM

## 2020-03-30 DIAGNOSIS — R911 Solitary pulmonary nodule: Secondary | ICD-10-CM

## 2020-03-30 NOTE — Progress Notes (Signed)
[address]       [place] [address]             [phone]     HPI: Mrs. Sawtell sent for consultation regarding multiple lung nodules.  Rachael Villanueva is a 69 year old woman with a past history significant for tobacco abuse, COPD, stage IIa adenocarcinoma of the left upper lobe, Graves' disease, hypothyroidism post ablation, hyperparathyroidism, pulmonary embolism in 2020 and anxiety.  She was found to have a left upper lobe lung nodule on a CT in 2018.  On a repeat Pete CT the nodule had increased in size.  On PET CT the nodule was hypermetabolic with an SUV of 2 despite only being about 8 mm in diameter.  I did a lingular segmentectomy and node dissection in February 2019.  The nodule turned out to be an T1, N1, stage IIa adenocarcinoma.  There were no actionable mutations on molecular testing.  She saw Dr. Julien Nordmann as well as an oncologist at [facility] and opted not to have chemotherapy.  She has been followed since then.  She recently saw Dr. Julien Nordmann.  A CT of the chest showed multiple new bilateral pulmonary nodules suspicious for metastases.  A PET/CT showed no significant uptake associated with these nodules although they are all less than a centimeter in size.  There was some vague activity in the subcarinal region but no node the visible on the CT images.  She says she feels well.  She has a lot of questions about the CT and PET/CT findings.  She has had a cough which is productive of mucus.  Past Medical History:  Diagnosis Date   Anxiety    Depression    Graves' disease    Hypercalcemia    Hypothyroidism    s/p post ablation for Graves Disease   Lung nodule    Sleep disorder    Thyroid disease    Vitamin D deficiency     Current Outpatient Medications  Medication Sig Dispense Refill   ALPRAZolam (XANAX) 0.5 MG tablet Take 0.25-0.5 mg by mouth at bedtime as needed for anxiety or sleep.      Armodafinil 250 MG tablet Take 250 mg by mouth  daily.     escitalopram (LEXAPRO) 10 MG tablet Take 20 mg by mouth daily.      levothyroxine (SYNTHROID) 125 MCG tablet Take 125 mcg by mouth as directed. Half a tablet on sundays and 1 tablet rest of the week      traZODone (DESYREL) 50 MG tablet Take 50-100 mg by mouth at bedtime.   0   No current facility-administered medications for this visit.    Physical Exam BP (!) 147/90 (BP Location: Right Arm, Patient Position: Sitting, Cuff Size: Normal) Comment: recheck   Pulse 78    Resp 20    Ht 5' (1.524 m)    Wt 127 lb (57.6 kg)    SpO2 99% Comment: RA   BMI 24.51 kg/m  69 year old woman in no acute distress Alert and oriented x3 with no focal neurologic deficit No cervical or supraclavicular adenopathy Lungs clear bilaterally Cardiac regular rate and rhythm  Diagnostic Tests: CT CHEST WITH CONTRAST  TECHNIQUE: Multidetector CT imaging of the chest was performed during intravenous contrast administration.  CONTRAST:  50mL OMNIPAQUE IOHEXOL 300 MG/ML  SOLN  COMPARISON:  Most recent CT chest 06/27/2019.  06/26/2017 PET-CT.  FINDINGS: Cardiovascular: No significant vascular findings. Normal heart size. No pericardial effusion.  Mediastinum/Nodes: No  axillary or supraclavicular adenopathy. No mediastinal adenopathy. No pericardial effusion. Small hiatal hernia.  Lungs/Pleura: There are new bilateral small round pulmonary nodules of varying size in a pattern consistent with metastatic pulmonary nodules. Nodules less than 10 mm each.  Example nodule in the posterior RIGHT lower lobe against the pleural surface measures 6 mm (image 102/8).  LEFT lobe nodule measuring 7 mm image 104.  Three adjacent LEFT lobe nodules measuring 5 mm each on image 75 there 73.  RIGHT upper lobe nodule measures 4 mm on image 35/8.  Approximately 20 nodules per lung.  Postsurgical change in the LEFT upper lobe without evidence of local recurrence. Stable thickening along the  resection scar.  Upper Abdomen: Limited view of the liver, kidneys, pancreas are unremarkable. Normal adrenal glands.  Musculoskeletal: No aggressive osseous lesion.  IMPRESSION: 1. 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 2. Postsurgical change in LEFT upper lobe without evidence of local recurrence. 3. No lymphadenopathy.  These results will be called to the ordering clinician or representative by the Radiologist Assistant, and communication documented in the PACS or [employer].   Electronically Signed   By: Suzy Bouchard M.D.   On: 03/16/2020 11:55 PET/CT IMPRESSION: 1. No hypermetabolism to correspond to the bilateral pulmonary nodules. These are primarily below PET resolution. There remain suspicious for pulmonary metastasis. 2. Nonspecific hypermetabolism in the subcarinal station, without adenopathy. Favored to be physiologic/reactive. Recommend attention on follow-up. 3. No evidence of hypermetabolic extrathoracic metastasis. 4. Aortic atherosclerosis (ICD10-I70.0), coronary artery atherosclerosis and emphysema (ICD10-J43.9). 5. Sinus disease.   Electronically Signed   By: Abigail Miyamoto M.D.   On: 03/26/2020 08:21 I personally reviewed the CT and PET/CT images and concur with the findings noted above  Impression: Rachael Villanueva is a 69 year old woman with a past history significant for tobacco abuse, COPD, stage IIa adenocarcinoma of the left upper lobe, Graves' disease, hypothyroidism post ablation, hyperparathyroidism, pulmonary embolism in 2020 and anxiety.  Recently on CT she was found to have multiple new bilateral subcentimeter lung nodules.  PET/CT showed no significant uptake in the nodules.  Differential diagnosis basically boils down to pulmonary metastases versus an atypical infection.  This would be a highly unusual appearance for infection.  Given her history, it is most likely  recurrent lung cancer.  I reviewed the CT and PET with Mrs. Tokarski and her husband.  We discussed the differential diagnosis.  The first issue is whether to try to make a definitive diagnosis or continue with radiographic follow-up.  I recommended that we try to establish a definitive diagnosis.  There are 3 options for biopsying the nodules.  Navigational bronchoscopy could be done but I think the odds of getting a definitive diagnosis are very low.  Second option would be a CT-guided biopsy.  That is not guaranteed to get a definitive answer but I think he has a better chance then bronchoscopy.  The final option would be surgical biopsy.  That would give a definitive diagnosis but would involve a major surgical procedure with its attendant discomfort and risks.  She is adamantly opposed to having a surgical biopsy.  I reviewed the films with Dr. [name] of interventional radiology.  He thinks it is possible to get a CT-guided biopsy of one of the right lower lobe nodules.  I recommended to Mrs. Brisbon that we do a CT-guided biopsy.  I did emphasize that while a positive result can be definitive, a negative result  could be a false negative.  She would need continued follow-up regardless.  Plan:  We will have Dr. Laurence Ferrari of IR attempt CT-guided needle biopsy of right lower lobe lung nodule  I spent 30 minutes in review of records, review of images, and in consultation with Mrs. Emmons today.  Melrose Nakayama, MD [facility] [phone]

## 2020-03-31 ENCOUNTER — Encounter (HOSPITAL_COMMUNITY): Payer: Self-pay | Admitting: Radiology

## 2020-03-31 NOTE — Progress Notes (Signed)
Rachael Villanueva Female, 69 y.o., 1951-05-20 MRN:  [id number] Phone:  [phone] [name]) PCP:  Leeroy Cha, MD Coverage:  [place] [employer] Medicare Hmo/Ppo Next Appt With Internal Medicine 09/06/2020 at 11:10 AM  RE: CT Biopsy Received: Today Markus Daft, MD  Garth Bigness D According to clinic note this was approved by Dr. Laurence Ferrari. CT guided biopsy of a right lung nodule. Try to schedule with Dr. Laurence Ferrari if possible.   Henn       Previous Messages   ----- Message -----  From: Garth Bigness D  Sent: 03/30/2020  5:04 PM EDT  To: Ir Procedure Requests  Subject: CT Biopsy                     Procedure:  CT Biopsy   Reason:  Nodule of lower lobe of right lung   History: NM PET, CT in computer   Provider: [place] Charon C   Provider Contact: [phone]

## 2020-04-02 ENCOUNTER — Encounter (HOSPITAL_COMMUNITY): Payer: Self-pay | Admitting: Radiology

## 2020-04-02 NOTE — Progress Notes (Signed)
Rachael Villanueva Female, 70 y.o., 15-Dec-1950 MRN:  [id number] Phone:  [phone] [name]) PCP:  Leeroy Cha, MD Coverage:  [place] [employer] Medicare Hmo/Ppo Next Appt With Radiology ([facility]-CT 3) 04/08/2020 at 11:00 AM  RE: CT Biopsy Received: Today [name], MD  Garth Bigness D Yes, thank you.   HKM       Previous Messages   ----- Message -----  From: Garth Bigness D  Sent: 03/31/2020  4:58 PM EDT  To: [name], MD  Subject: RE: CT Biopsy                   Hi, I did not see office note in chart from provider when I sent this in for review. I think I move to fast sometimes. I did place in for review and Dr. Anselm Pancoast stated that you had already approved this one and for me to set it up with you but you are not at [facility] on any day that we had available. Patient is scheduled at [facility] on 04/08/2020, I hope that is okay. If not let me know and I will wait until you are at [facility] again.  ----- Message -----  From: Markus Daft, MD  Sent: 03/31/2020  7:45 AM EDT  To: Jillyn Hidden  Subject: RE: CT Biopsy                   According to clinic note this was approved by Dr. Laurence Ferrari. CT guided biopsy of a right lung nodule. Try to schedule with Dr. Laurence Ferrari if possible.   Henn  ----- Message -----  From: Garth Bigness D  Sent: 03/30/2020  5:04 PM EDT  To: Ir Procedure Requests  Subject: CT Biopsy                     Procedure:  CT Biopsy   Reason:  Nodule of lower lobe of right lung   History: NM PET, CT in computer   Provider: Modesto Charon C   Provider Contact: [phone]

## 2020-04-05 ENCOUNTER — Other Ambulatory Visit (HOSPITAL_COMMUNITY)
Admission: RE | Admit: 2020-04-05 | Discharge: 2020-04-05 | Disposition: A | Discharge status: Home / Self Care | Payer: Medicare HMO | Source: Ambulatory Visit | Attending: Thoracic Surgery (Cardiothoracic Vascular Surgery) | Admitting: Thoracic Surgery (Cardiothoracic Vascular Surgery)

## 2020-04-05 DIAGNOSIS — Z01812 Encounter for preprocedural laboratory examination: Secondary | ICD-10-CM | POA: Diagnosis not present

## 2020-04-05 DIAGNOSIS — Z20822 Contact with and (suspected) exposure to covid-19: Secondary | ICD-10-CM | POA: Diagnosis not present

## 2020-04-05 LAB — SARS CORONAVIRUS 2 (TAT 6-24 HRS): SARS Coronavirus 2: NEGATIVE

## 2020-04-07 ENCOUNTER — Other Ambulatory Visit: Payer: Self-pay | Admitting: Radiology

## 2020-04-08 ENCOUNTER — Ambulatory Visit (HOSPITAL_COMMUNITY)
Admission: RE | Admit: 2020-04-08 | Discharge: 2020-04-08 | Disposition: A | Discharge status: Home / Self Care | Payer: Medicare HMO | Source: Ambulatory Visit | Attending: Interventional Radiology | Admitting: Interventional Radiology

## 2020-04-08 ENCOUNTER — Ambulatory Visit (HOSPITAL_COMMUNITY): Admission: RE | Admit: 2020-04-08 | Payer: Medicare HMO | Source: Ambulatory Visit

## 2020-04-08 ENCOUNTER — Ambulatory Visit (HOSPITAL_COMMUNITY)
Admission: RE | Admit: 2020-04-08 | Discharge: 2020-04-08 | Disposition: A | Discharge status: Home / Self Care | Payer: Medicare HMO | Source: Ambulatory Visit | Attending: Thoracic Surgery (Cardiothoracic Vascular Surgery) | Admitting: Thoracic Surgery (Cardiothoracic Vascular Surgery)

## 2020-04-08 ENCOUNTER — Other Ambulatory Visit: Payer: Self-pay

## 2020-04-08 ENCOUNTER — Encounter (HOSPITAL_COMMUNITY): Payer: Self-pay

## 2020-04-08 DIAGNOSIS — R911 Solitary pulmonary nodule: Secondary | ICD-10-CM

## 2020-04-08 DIAGNOSIS — Z87891 Personal history of nicotine dependence: Secondary | ICD-10-CM | POA: Diagnosis not present

## 2020-04-08 DIAGNOSIS — Z79890 Hormone replacement therapy (postmenopausal): Secondary | ICD-10-CM | POA: Diagnosis not present

## 2020-04-08 DIAGNOSIS — G479 Sleep disorder, unspecified: Secondary | ICD-10-CM | POA: Insufficient documentation

## 2020-04-08 DIAGNOSIS — Z801 Family history of malignant neoplasm of trachea, bronchus and lung: Secondary | ICD-10-CM | POA: Insufficient documentation

## 2020-04-08 DIAGNOSIS — E890 Postprocedural hypothyroidism: Secondary | ICD-10-CM | POA: Insufficient documentation

## 2020-04-08 DIAGNOSIS — Z9889 Other specified postprocedural states: Secondary | ICD-10-CM

## 2020-04-08 DIAGNOSIS — J449 Chronic obstructive pulmonary disease, unspecified: Secondary | ICD-10-CM | POA: Insufficient documentation

## 2020-04-08 DIAGNOSIS — C3431 Malignant neoplasm of lower lobe, right bronchus or lung: Secondary | ICD-10-CM | POA: Insufficient documentation

## 2020-04-08 DIAGNOSIS — Z85118 Personal history of other malignant neoplasm of bronchus and lung: Secondary | ICD-10-CM | POA: Diagnosis not present

## 2020-04-08 DIAGNOSIS — R69 Illness, unspecified: Secondary | ICD-10-CM | POA: Diagnosis not present

## 2020-04-08 DIAGNOSIS — F419 Anxiety disorder, unspecified: Secondary | ICD-10-CM | POA: Insufficient documentation

## 2020-04-08 DIAGNOSIS — R918 Other nonspecific abnormal finding of lung field: Secondary | ICD-10-CM | POA: Diagnosis not present

## 2020-04-08 DIAGNOSIS — Z79899 Other long term (current) drug therapy: Secondary | ICD-10-CM | POA: Diagnosis not present

## 2020-04-08 DIAGNOSIS — C3491 Malignant neoplasm of unspecified part of right bronchus or lung: Secondary | ICD-10-CM | POA: Diagnosis not present

## 2020-04-08 LAB — CBC
HCT: 41.6 % (ref 36.0–46.0)
Hemoglobin: 12.8 g/dL (ref 12.0–15.0)
MCH: 26.9 pg (ref 26.0–34.0)
MCHC: 30.8 g/dL (ref 30.0–36.0)
MCV: 87.4 fL (ref 80.0–100.0)
Platelets: 273 10*3/uL (ref 150–400)
RBC: 4.76 MIL/uL (ref 3.87–5.11)
RDW: 14.4 % (ref 11.5–15.5)
WBC: 5.8 10*3/uL (ref 4.0–10.5)
nRBC: 0 % (ref 0.0–0.2)

## 2020-04-08 LAB — PROTIME-INR
INR: 0.9 (ref 0.8–1.2)
Prothrombin Time: 11.8 seconds (ref 11.4–15.2)

## 2020-04-08 MED ORDER — LIDOCAINE-EPINEPHRINE 1 %-1:100000 IJ SOLN
INTRAMUSCULAR | Status: AC
  Filled 2020-04-08: qty 1

## 2020-04-08 MED ORDER — FENTANYL CITRATE (PF) 100 MCG/2ML IJ SOLN
INTRAMUSCULAR | Status: AC | PRN
  Administered 2020-04-08: 50 ug via INTRAVENOUS

## 2020-04-08 MED ORDER — MIDAZOLAM HCL 2 MG/2ML IJ SOLN
INTRAMUSCULAR | Status: AC | PRN
  Administered 2020-04-08 (×2): 0.5 mg via INTRAVENOUS

## 2020-04-08 MED ORDER — SODIUM CHLORIDE 0.9 % IV SOLN: INTRAVENOUS | Status: DC

## 2020-04-08 MED ORDER — FENTANYL CITRATE (PF) 100 MCG/2ML IJ SOLN
INTRAMUSCULAR | Status: AC
  Filled 2020-04-08: qty 2

## 2020-04-08 MED ORDER — MIDAZOLAM HCL 2 MG/2ML IJ SOLN
INTRAMUSCULAR | Status: AC
  Filled 2020-04-08: qty 2

## 2020-04-08 NOTE — Progress Notes (Signed)
Discharge instructions reviewed with patient and family. Verbalized understanding. 

## 2020-04-08 NOTE — H&P (Signed)
Chief Complaint: Patient was seen in consultation today for right lung nodule  Referring Physician(s): Spickard C  Supervising Physician: Sandi Mariscal  Patient Status: [facility] - Out-pt  History of Present Illness: CLARYSSA SANDNER is a 69 y.o. 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  Patient known Dr. Roxan Hockey due to her history of adenocarcinoma s/p lingular segmentectomy with node dissection in February 2019.  She opted against chemotherapy and has been followed by Oncology.  Subsequent PET scan after CT did not show significant uptake, however due to concern for recurrent lung cancer, she was referred to IR for biopsy.   Case was reviewed by Dr. Laurence Ferrari who approves the patient for image-guided lung biopsy.   Mrs. Castagnola presents today in her usual state of health.  She denies fever, chills, shortness of breath, cough, abdominal pain. She has several questions which are answered. She has been NPO.  She does not take blood thinners. She lives at home with her husband who will be her ride home today.   Past Medical History:  Diagnosis Date  . Anxiety   . Depression   . Graves' disease   . Hypercalcemia   . Hypothyroidism    s/p post ablation for Graves Disease  . Lung nodule   . Sleep disorder   . Thyroid disease   . Vitamin D deficiency     Past Surgical History:  Procedure Laterality Date  . ABLATION     for graves dX  . COLONOSCOPY    . dental implant    . eye compression Bilateral    due to graves disease done at [facility]   . EYE SURGERY    . PARATHYROIDECTOMY    . VIDEO ASSISTED THORACOSCOPY (VATS)/ LOBECTOMY Left 08/13/2017   Procedure: VIDEO ASSISTED THORACOSCOPY (VATS)/ LOBECTOMY;  Surgeon: Melrose Nakayama, MD;  Location: [facility];  Service: Thoracic;  Laterality: Left;    Allergies: Grass pollen(k-o-[id number]-t-swt  vern), Molds & smuts, and Other  Medications: Prior to Admission medications   Medication Sig Start Date End Date Taking? Authorizing Provider  ALPRAZolam Duanne Moron) 0.5 MG tablet Take 0.25-0.5 mg by mouth at bedtime as needed for anxiety or sleep.    Yes [provider]  Armodafinil 250 MG tablet Take 250 mg by mouth daily.   Yes [provider]  escitalopram (LEXAPRO) 10 MG tablet Take 20 mg by mouth daily.    Yes [provider]  levothyroxine (SYNTHROID) 125 MCG tablet Take 125 mcg by mouth as directed. Half a tablet on sundays and 1 tablet rest of the week    Yes [provider]  traZODone (DESYREL) 50 MG tablet Take 50-100 mg by mouth at bedtime.  04/24/16  Yes [provider]     Family History  Problem Relation Age of Onset  . Thyroid disease Mother   . Lung cancer Mother   . Parkinson's disease Father   . Thyroid disease Brother     Social History   Socioeconomic History  . Marital status: Married    Spouse name: Not on file  . Number of children: 0  . Years of education: college  . Highest education level: Not on file  Occupational History  . Occupation: Risk analyst   Tobacco Use  . Smoking status: Former Smoker    Packs/day: 0.50    Types: Cigarettes    Quit date:  08/12/2017    Years since quitting: 2.6  . Smokeless tobacco: Never Used  . Tobacco comment: no more than 1/2 pack per day   Vaping Use  . Vaping Use: Unknown  Substance and Sexual Activity  . Alcohol use: No    Alcohol/week: 0.0 standard drinks    Comment: Quit 1989  . Drug use: No  . Sexual activity: Not on file  Other Topics Concern  . Not on file  Social History Narrative   Drinks 1.5 coffee a day    Social Determinants of Health   Financial Resource Strain:   . Difficulty of Paying Living Expenses: Not on file  Food Insecurity:   . Worried About Charity fundraiser in the Last Year: Not on file  . Ran Out of Food in the Last Year: Not on file    Transportation Needs:   . Lack of Transportation (Medical): Not on file  . Lack of Transportation (Non-Medical): Not on file  Physical Activity:   . Days of Exercise per Week: Not on file  . Minutes of Exercise per Session: Not on file  Stress:   . Feeling of Stress : Not on file  Social Connections:   . Frequency of Communication with Friends and Family: Not on file  . Frequency of Social Gatherings with Friends and Family: Not on file  . Attends Religious Services: Not on file  . Active Member of Clubs or Organizations: Not on file  . Attends Archivist Meetings: Not on file  . Marital Status: Not on file     Review of Systems: A 12 point ROS discussed and pertinent positives are indicated in the HPI above.  All other systems are negative.  Review of Systems  Constitutional: Negative for fatigue and fever.  Respiratory: Negative for cough and shortness of breath.   Cardiovascular: Negative for chest pain.  Gastrointestinal: Negative for abdominal pain, nausea and vomiting.  Genitourinary: Negative for dysuria.  Musculoskeletal: Negative for back pain.  Psychiatric/Behavioral: Negative for behavioral problems and confusion.    Vital Signs: BP 121/77   Pulse 76   Ht 5' (1.524 m)   Wt 127 lb (57.6 kg)   SpO2 99%   BMI 24.80 kg/m   Physical Exam Vitals and nursing note reviewed.  Constitutional:      Appearance: Normal appearance.  HENT:     Mouth/Throat:     Mouth: Mucous membranes are moist.     Pharynx: Oropharynx is clear.  Cardiovascular:     Rate and Rhythm: Normal rate and regular rhythm.  Pulmonary:     Effort: Pulmonary effort is normal.     Breath sounds: Normal breath sounds.  Abdominal:     General: Abdomen is flat.     Palpations: Abdomen is soft.  Skin:    General: Skin is warm and dry.  Neurological:     General: No focal deficit present.     Mental Status: She is alert and oriented to person, place, and time.  Psychiatric:         Mood and Affect: Mood normal.        Behavior: Behavior normal.        Thought Content: Thought content normal.        Judgment: Judgment normal.      MD Evaluation Airway: WNL Heart: WNL Abdomen: WNL Chest/ Lungs: WNL ASA  Classification: 3 Mallampati/Airway Score: Two   Imaging: CT Chest W Contrast  Result Date: 03/16/2020 CLINICAL DATA:  Primary Cancer Type: Lung Imaging Indication: Routine surveillance Interval therapy since last imaging? No Initial Cancer Diagnosis Date: 08/13/2017; Established by: Biopsy-proven Detailed Pathology: Stage IIA non-small cell lung cancer, adenocarcinoma. Primary Tumor location: Left upper lobe. Surgeries: Wedge resection of left upper lobe nodule, thoracoscopic lingular segmentectomy 08/13/2017. Thyroid ablation, parathyroidectomy. Chemotherapy: No Immunotherapy? No Radiation therapy? No EXAM: CT CHEST WITH CONTRAST TECHNIQUE: Multidetector CT imaging of the chest was performed during intravenous contrast administration. CONTRAST:  20mL OMNIPAQUE IOHEXOL 300 MG/ML  SOLN COMPARISON:  Most recent CT chest 06/27/2019.  06/26/2017 PET-CT. FINDINGS: Cardiovascular: No significant vascular findings. Normal heart size. No pericardial effusion. Mediastinum/Nodes: No axillary or supraclavicular adenopathy. No mediastinal adenopathy. No pericardial effusion. Small hiatal hernia. Lungs/Pleura: There are new bilateral small round pulmonary nodules of varying size in a pattern consistent with metastatic pulmonary nodules. Nodules less than 10 mm each. Example nodule in the posterior RIGHT lower lobe against the pleural surface measures 6 mm (image 102/8). LEFT lobe nodule measuring 7 mm image 104. Three adjacent LEFT lobe nodules measuring 5 mm each on image 75 there 73. RIGHT upper lobe nodule measures 4 mm on image 35/8. Approximately 20 nodules per lung. Postsurgical change in the LEFT upper lobe without evidence of local recurrence. Stable thickening along the resection  scar. Upper Abdomen: Limited view of the liver, kidneys, pancreas are unremarkable. Normal adrenal glands. Musculoskeletal: No aggressive osseous lesion. IMPRESSION: 1. 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 2. Postsurgical change in LEFT upper lobe without evidence of local recurrence. 3. No lymphadenopathy. These results will be called to the ordering clinician or representative by the Radiologist Assistant, and communication documented in the PACS or [employer]. Electronically Signed   By: Suzy Bouchard M.D.   On: 03/16/2020 11:55   NM PET Image Restag (PS) Skull Base To Thigh  Result Date: 03/26/2020 CLINICAL DATA:  Subsequent treatment strategy for non-small-cell lung cancer. Restaging. New pulmonary nodules on CT. COVID vaccine x2 in left arm, unsure of date. EXAM: NUCLEAR MEDICINE PET SKULL BASE TO THIGH TECHNIQUE: 6.5 mCi F-18 FDG was injected intravenously. Full-ring PET imaging was performed from the skull base to thigh after the radiotracer. CT data was obtained and used for attenuation correction and anatomic localization. Fasting blood glucose: 102 mg/dl COMPARISON:  Chest CTs, most recent 03/16/2020. PET of 06/26/2017 FINDINGS: Mediastinal blood pool activity: SUV max 2.6 Liver activity: SUV max NA NECK: No areas of abnormal hypermetabolism. Incidental CT findings: No cervical adenopathy. Mucosal thickening of the right maxillary sinus. Fluid within the sphenoid sinus. CHEST: No hypermetabolism to correspond to the bilateral pulmonary nodules. These are primarily below PET resolution. Subcarinal hypermetabolism without adenopathy. This measures a S.U.V. max of 3.0, including on 65/4 Incidental CT findings: Multiple bilateral pulmonary nodules again identified. Similar in size and distribution to on the prior exam. An index right lower lobe 6 mm nodule on 39/8 is unchanged. Aortic and coronary artery atherosclerosis. Tiny hiatal  hernia. Mild centrilobular emphysema. ABDOMEN/PELVIS: No abdominopelvic parenchymal or nodal hypermetabolism. Incidental CT findings: Normal adrenal glands. Abdominal aortic atherosclerosis. Pelvic floor laxity. SKELETON: Hypermetabolism superficial to both greater trochanters is likely indicative of trochanteric bursitis. There is also rotator cuff hypermetabolism which is likely degenerative. Incidental CT findings: Osteopenia IMPRESSION: 1. No hypermetabolism to correspond to the bilateral pulmonary nodules. These are primarily below PET resolution. There remain suspicious for pulmonary metastasis. 2. Nonspecific hypermetabolism in the subcarinal station, without adenopathy. Favored to be physiologic/reactive. Recommend attention  on follow-up. 3. No evidence of hypermetabolic extrathoracic metastasis. 4. Aortic atherosclerosis (ICD10-I70.0), coronary artery atherosclerosis and emphysema (ICD10-J43.9). 5. Sinus disease. Electronically Signed   By: Abigail Miyamoto M.D.   On: 03/26/2020 08:21    Labs:  CBC: Recent Labs    06/27/19 0924 03/16/20 0958 04/08/20 0946  WBC 5.9 7.1 5.8  HGB 13.0 12.5 12.8  HCT 40.5 39.2 41.6  PLT 232 225 273    COAGS: Recent Labs    04/08/20 0946  INR 0.9    BMP: Recent Labs    06/27/19 0924 03/16/20 0958  NA 141 140  K 4.2 3.8  CL 104 106  CO2 27 28  GLUCOSE 99 97  BUN 20 19  CALCIUM 9.0 9.2  CREATININE 0.81 0.74  GFRNONAA >60 >60  GFRAA >60 >60    LIVER FUNCTION TESTS: Recent Labs    06/27/19 0924 03/16/20 0958  BILITOT 0.2* <0.2*  AST 24 16  ALT 25 19  ALKPHOS 106 114  PROT 7.0 6.8  ALBUMIN 4.0 3.7    TUMOR MARKERS: No results for input(s): AFPTM, CEA, CA199, CHROMGRNA in the last 8760 hours.  Assessment and Plan: Patient with past medical history of Stage IIa adenocarcinoma of the lung presents with complaint of multiple nodules found on recent CT imaging.  IR consulted for lung nodule biopsy at the request of Dr.  Roxan Hockey. Case reviewed by Dr. Laurence Ferrari who approves patient for procedure.  Patient presents today in their usual state of health.  She has been NPO and is not currently on blood thinners.   Risks and benefits of CT guided lung nodule biopsy was discussed with the patient including, but not limited to bleeding, hemoptysis, respiratory failure requiring intubation, infection, pneumothorax requiring chest tube placement, stroke from air embolism or even death.  All of the patient's questions were answered and the patient is agreeable to proceed.  Consent signed and in chart.  Thank you for this interesting consult.  I greatly enjoyed meeting ADDYSON TRAUB and look forward to participating in their care.  A copy of this report was sent to the requesting provider on this date.  Electronically Signed: Docia Barrier, PA 04/08/2020, 11:06 AM   I spent a total of  40 Minutes   in face to face in clinical consultation, greater than 50% of which was counseling/coordinating care for lung nodules.

## 2020-04-08 NOTE — Procedures (Signed)
Pre procedural Dx: New pulmonary nodules  Post procedural Dx: Same  Technically successful CT guided biopsy of dominant right lobe lobe pulmonary nodule.   EBL: None.  Complications: None immediate.   Ronny Bacon, MD Pager #: [phone]

## 2020-04-08 NOTE — Discharge Instructions (Addendum)
Lung Biopsy, Care After This sheet gives you information about how to care for yourself after your procedure. Your health care provider may also give you more specific instructions depending on the type of biopsy you had. If you have problems or questions, contact your health care provider. What can I expect after the procedure? After the procedure, it is common to have:  A cough.  A sore throat.  Pain where a needle, bronchoscope, or incision was used to collect a biopsy sample (biopsy site). Follow these instructions at home: Medicines  Take over-the-counter and prescription medicines only as told by your health care provider.  Do not drink alcohol if your health care provider tells you not to drink.  Ask your health care provider if the medicine prescribed to you: ? Requires you to avoid driving or using heavy machinery. ? Can cause constipation. You may need to take these actions to prevent or treat constipation:  Drink enough fluid to keep your urine pale yellow.  Take over-the-counter or prescription medicines.  Eat foods that are high in fiber, such as beans, whole grains, and fresh fruits and vegetables.  Limit foods that are high in fat and processed sugars, such as fried or sweet foods.  Do not drive for 24 hours if you were given a sedative. Biopsy site care   Follow instructions from your health care provider about how to take care of your biopsy site. Make sure you: ? Wash your hands with soap and water before and after you change your bandage (dressing). If soap and water are not available, use hand sanitizer. ? Change your dressing as told by your health care provider. ? Leave stitches (sutures), skin glue, or adhesive strips in place. These skin closures may need to stay in place for 2 weeks or longer. If adhesive strip edges start to loosen and curl up, you may trim the loose edges. Do not remove adhesive strips completely unless your health care provider tells  you to do that.  Do not take baths, swim, or use a hot tub until your health care provider approves. Ask your health care provider if you may take showers. You may only be allowed to take sponge baths.  Check your biopsy site every day for signs of infection. Check for: ? Redness, swelling, or more pain. ? Fluid or blood. ? Warmth. ? Pus or a bad smell. General instructions  Return to your normal activities as told by your health care provider. Ask your health care provider what activities are safe for you.  It is up to you to get the results of your procedure. Ask your health care provider, or the department that is doing the procedure, when your results will be ready.  Keep all follow-up visits as told by your health care provider. This is important. Contact a health care provider if:  You have a fever.  You have redness, swelling, or more pain around your biopsy site.  You have fluid or blood coming from your biopsy site.  Your biopsy site feels warm to the touch.  You have pus or a bad smell coming from your biopsy site.  You have pain that does not get better with medicine. Get help right away if:  You cough up blood.  You have trouble breathing.  You have chest pain.  You lose consciousness. Summary  After the procedure, it is common to have a sore throat and a cough.  Return to your normal activities as told by your  health care provider. Ask your health care provider what activities are safe for you.  Take over-the-counter and prescription medicines only as told by your health care provider.  Report any unusual symptoms to your health care provider. This information is not intended to replace advice given to you by your health care provider. Make sure you discuss any questions you have with your health care provider. Document Revised: 07/31/2018 Document Reviewed: 07/25/2016 Elsevier Patient Education  [place]. Moderate Conscious Sedation,  Adult Sedation is the use of medicines to promote relaxation and relieve discomfort and anxiety. Moderate conscious sedation is a type of sedation. Under moderate conscious sedation, you are less alert than normal, but you are still able to respond to instructions, touch, or both. Moderate conscious sedation is used during short medical and dental procedures. It is milder than deep sedation, which is a type of sedation under which you cannot be easily woken up. It is also milder than general anesthesia, which is the use of medicines to make you unconscious. Moderate conscious sedation allows you to return to your regular activities sooner. Tell a health care provider about:  Any allergies you have.  All medicines you are taking, including vitamins, herbs, eye drops, creams, and over-the-counter medicines.  Use of steroids (by mouth or creams).  Any problems you or family members have had with sedatives and anesthetic medicines.  Any blood disorders you have.  Any surgeries you have had.  Any medical conditions you have, such as sleep apnea.  Whether you are pregnant or may be pregnant.  Any use of cigarettes, alcohol, marijuana, or street drugs. What are the risks? Generally, this is a safe procedure. However, problems may occur, including:  Getting too much medicine (oversedation).  Nausea.  Allergic reaction to medicines.  Trouble breathing. If this happens, a breathing tube may be used to help with breathing. It will be removed when you are awake and breathing on your own.  Heart trouble.  Lung trouble. What happens before the procedure? Staying hydrated Follow instructions from your health care provider about hydration, which may include:  Up to 2 hours before the procedure - you may continue to drink clear liquids, such as water, clear fruit juice, black coffee, and plain tea. Eating and drinking restrictions Follow instructions from your health care provider about  eating and drinking, which may include:  8 hours before the procedure - stop eating heavy meals or foods such as meat, fried foods, or fatty foods.  6 hours before the procedure - stop eating light meals or foods, such as toast or cereal.  6 hours before the procedure - stop drinking milk or drinks that contain milk.  2 hours before the procedure - stop drinking clear liquids. Medicine Ask your health care provider about:  Changing or stopping your regular medicines. This is especially important if you are taking diabetes medicines or blood thinners.  Taking medicines such as aspirin and ibuprofen. These medicines can thin your blood. Do not take these medicines before your procedure if your health care provider instructs you not to.  Tests and exams  You will have a physical exam.  You may have blood tests done to show: ? How well your kidneys and liver are working. ? How well your blood can clot. General instructions  Plan to have someone take you home from the hospital or clinic.  If you will be going home right after the procedure, plan to have someone with you for 24 hours.  What happens during the procedure?  An IV tube will be inserted into one of your veins.  Medicine to help you relax (sedative) will be given through the IV tube.  The medical or dental procedure will be performed. What happens after the procedure?  Your blood pressure, heart rate, breathing rate, and blood oxygen level will be monitored often until the medicines you were given have worn off.  Do not drive for 24 hours. This information is not intended to replace advice given to you by your health care provider. Make sure you discuss any questions you have with your health care provider. Document Revised: 06/08/2017 Document Reviewed: 10/16/2015 Elsevier Patient Education  2020 [employer].

## 2020-04-09 LAB — SURGICAL PATHOLOGY

## 2020-04-15 ENCOUNTER — Encounter: Payer: Self-pay | Admitting: *Deleted

## 2020-04-15 ENCOUNTER — Other Ambulatory Visit: Payer: Self-pay | Admitting: *Deleted

## 2020-04-15 ENCOUNTER — Telehealth: Payer: Self-pay | Admitting: Internal Medicine

## 2020-04-15 DIAGNOSIS — R911 Solitary pulmonary nodule: Secondary | ICD-10-CM

## 2020-04-15 NOTE — Progress Notes (Signed)
I followed up on [name] report of 2019 and updated Dr. Julien Nordmann.

## 2020-04-15 NOTE — Telephone Encounter (Signed)
Per 10/6 sch msg. Left message with patient appointment times 10/25 labs at 1:30 and MD at 12

## 2020-04-15 NOTE — Progress Notes (Signed)
Ms. Currin case was discussed today in cancer conference.  Per Dr. Julien Nordmann, he would like to see patient.  I updated scheduling team to call and schedule her with labs.

## 2020-04-15 NOTE — Progress Notes (Signed)
The proposed treatment discussed in cancer conference 04/15/20 is for discussion purpose only and is not a binding recommendations.  The patient was not physically examined nor present for their treatment options.  Therefore, final treatment plans cannot be decided.

## 2020-04-16 ENCOUNTER — Telehealth: Payer: Self-pay | Admitting: Thoracic Surgery (Cardiothoracic Vascular Surgery)

## 2020-04-16 ENCOUNTER — Encounter: Payer: Self-pay | Admitting: *Deleted

## 2020-04-16 DIAGNOSIS — R69 Illness, unspecified: Secondary | ICD-10-CM | POA: Diagnosis not present

## 2020-04-16 NOTE — Progress Notes (Signed)
Requested Foundation One and PDL 1 testing on recent path per Dr. Julien Nordmann. I updated path dept to send.

## 2020-04-16 NOTE — Telephone Encounter (Signed)
Called Rachael Villanueva to discuss path results.  Unfortunately, path result did not come to my inbox  Path reviewed in conference yesterday Am  Showed metastatic adenocarcinoma  She has an appointment with Dr. Julien Nordmann later this month  Revonda Standard. Roxan Hockey, MD [facility] [phone]

## 2020-04-23 ENCOUNTER — Encounter: Payer: Self-pay | Admitting: *Deleted

## 2020-04-23 ENCOUNTER — Encounter: Payer: Self-pay | Admitting: Internal Medicine

## 2020-04-23 NOTE — Progress Notes (Signed)
I followed up on Ms. Helbing's foundation one report.  I did not see her information in the patient portal.  I contacted path dept for an update.

## 2020-04-27 ENCOUNTER — Encounter: Payer: Self-pay | Admitting: Internal Medicine

## 2020-05-03 ENCOUNTER — Inpatient Hospital Stay (HOSPITAL_BASED_OUTPATIENT_CLINIC_OR_DEPARTMENT_OTHER): Payer: Medicare HMO | Admitting: Internal Medicine

## 2020-05-03 ENCOUNTER — Inpatient Hospital Stay: Payer: Medicare HMO | Attending: Internal Medicine

## 2020-05-03 ENCOUNTER — Other Ambulatory Visit: Payer: Self-pay

## 2020-05-03 ENCOUNTER — Encounter: Payer: Self-pay | Admitting: Internal Medicine

## 2020-05-03 VITALS — BP 152/79 | HR 90 | Temp 97.3°F | Resp 18 | Ht 60.0 in | Wt 127.9 lb

## 2020-05-03 DIAGNOSIS — C3492 Malignant neoplasm of unspecified part of left bronchus or lung: Secondary | ICD-10-CM

## 2020-05-03 DIAGNOSIS — C3490 Malignant neoplasm of unspecified part of unspecified bronchus or lung: Secondary | ICD-10-CM

## 2020-05-03 DIAGNOSIS — Z902 Acquired absence of lung [part of]: Secondary | ICD-10-CM | POA: Insufficient documentation

## 2020-05-03 DIAGNOSIS — C7801 Secondary malignant neoplasm of right lung: Secondary | ICD-10-CM | POA: Insufficient documentation

## 2020-05-03 DIAGNOSIS — R911 Solitary pulmonary nodule: Secondary | ICD-10-CM

## 2020-05-03 DIAGNOSIS — C3412 Malignant neoplasm of upper lobe, left bronchus or lung: Secondary | ICD-10-CM | POA: Insufficient documentation

## 2020-05-03 LAB — CMP (CANCER CENTER ONLY)
ALT: 19 U/L (ref 0–44)
AST: 23 U/L (ref 15–41)
Albumin: 4.4 g/dL (ref 3.5–5.0)
Alkaline Phosphatase: 122 U/L (ref 38–126)
Anion gap: 7 (ref 5–15)
BUN: 14 mg/dL (ref 8–23)
CO2: 28 mmol/L (ref 22–32)
Calcium: 9.5 mg/dL (ref 8.9–10.3)
Chloride: 103 mmol/L (ref 98–111)
Creatinine: 0.81 mg/dL (ref 0.44–1.00)
GFR, Estimated: >60 mL/min (ref >60)
Glucose, Bld: 130 mg/dL — ABNORMAL HIGH (ref 70–99)
Potassium: 3.7 mmol/L (ref 3.5–5.1)
Sodium: 138 mmol/L (ref 135–145)
Total Bilirubin: 0.3 mg/dL (ref 0.3–1.2)
Total Protein: 7.3 g/dL (ref 6.5–8.1)

## 2020-05-03 LAB — CBC WITH DIFFERENTIAL (CANCER CENTER ONLY)
Abs Immature Granulocytes: 0.01 10*3/uL (ref 0.00–0.07)
Basophils Absolute: 0.1 10*3/uL (ref 0.0–0.1)
Basophils Relative: 1 %
Eosinophils Absolute: 0.2 10*3/uL (ref 0.0–0.5)
Eosinophils Relative: 2 %
HCT: 39.5 % (ref 36.0–46.0)
Hemoglobin: 12.7 g/dL (ref 12.0–15.0)
Immature Granulocytes: 0 %
Lymphocytes Relative: 24 %
Lymphs Abs: 2 10*3/uL (ref 0.7–4.0)
MCH: 27.8 pg (ref 26.0–34.0)
MCHC: 32.2 g/dL (ref 30.0–36.0)
MCV: 86.4 fL (ref 80.0–100.0)
Monocytes Absolute: 0.4 10*3/uL (ref 0.1–1.0)
Monocytes Relative: 4 %
Neutro Abs: 5.8 10*3/uL (ref 1.7–7.7)
Neutrophils Relative %: 69 %
Platelet Count: 296 10*3/uL (ref 150–400)
RBC: 4.57 MIL/uL (ref 3.87–5.11)
RDW: 15 % (ref 11.5–15.5)
WBC Count: 8.4 10*3/uL (ref 4.0–10.5)
nRBC: 0 % (ref 0.0–0.2)

## 2020-05-03 NOTE — Progress Notes (Signed)
[facility] [phone]   [phone]  OFFICE PROGRESS NOTE  Leeroy Cha, MD [address] [place] [address]  DIAGNOSIS:  1) recurrent and metastatic non-small cell lung cancer, adenocarcinoma initially diagnosed as stage IIA (T1a, N1, M0) non-small cell lung cancer, adenocarcinoma presented with right upper lobe small pulmonary nodules as well as metastatic adenocarcinoma to level 12L, diagnosed in February 2019.  The patient had disease recurrence with bilateral pulmonary nodules and biopsy-proven in September 2021. 2) acute pulmonary embolism incidentally diagnosed on CT scan of the chest on 12/24/2018 involving right lower lobe pulmonary artery segmental branches.  Biomarker Findings Microsatellite status - Cannot Be Determined Tumor Mutational Burden - Cannot Be Determined Genomic Findings For a complete list of the genes assayed, please refer to the Appendix. KRAS G12V 7 Disease relevant genes with no reportable alterations: EGFR, ALK, BRAF, MET, RET, ERBB2, ROS1  PDL1 expression 5%.  PRIOR THERAPY: Status post wedge resection of the left upper lobe nodule with thoracic lingular segmentectomy and mediastinal lymph node sampling under the care of Dr. Roxan Hockey on August 13, 2017.  CURRENT THERAPY:  1) currently undergoing work-up for the newly metastatic non-small cell lung cancer.  2) Xarelto 15 mg p.o. twice daily for 3 weeks followed by 20 mg p.o. daily 20 mg p.o. daily.  She started the first dose on 12/25/2018.  INTERVAL HISTORY: Rachael Villanueva 69 y.o. female returns to the clinic today for follow-up visit accompanied by her husband.  The patient is very anxious and stressed about her recent disease recurrence.  She has no chest pain but has mild shortness of breath with exertion with no cough or hemoptysis.  She denied having any recent weight loss or night sweats.  She has no nausea, vomiting, diarrhea or  constipation.  She denied having any headache or visual changes.  The patient was found on previous CT scan of the chest to have multiple bilateral pulmonary nodules.  She had a PET scan that showed no hypermetabolic activity in the subcentimeter pulmonary nodule.  The patient was seen by Dr. Roxan Hockey who ordered CT-guided core biopsy of one of the pulmonary nodule and this was performed from 1 of the right lower lobe pulmonary nodules.  The final pathology [id number]) was consistent with adenocarcinoma.  [facility] molecular studies and PD-L1 expression.  She is here today for evaluation and discussion of her treatment options.  MEDICAL HISTORY: Past Medical History:  Diagnosis Date  . Anxiety   . Depression   . Graves' disease   . Hypercalcemia   . Hypothyroidism    s/p post ablation for Graves Disease  . Lung nodule   . Sleep disorder   . Thyroid disease   . Vitamin D deficiency     ALLERGIES:  is allergic to grass pollen([id number] vern), molds & smuts, and other.  MEDICATIONS:  Current Outpatient Medications  Medication Sig Dispense Refill  . escitalopram (LEXAPRO) 10 MG tablet Take 20 mg by mouth daily.     Marland Kitchen levothyroxine (SYNTHROID) 125 MCG tablet Take 125 mcg by mouth as directed. Half a tablet on sundays and 1 tablet rest of the week     . ALPRAZolam (XANAX) 0.5 MG tablet Take 0.25-0.5 mg by mouth at bedtime as needed for anxiety or sleep.     . Armodafinil 250 MG tablet Take 250 mg by mouth daily.    . traZODone (DESYREL)  50 MG tablet Take 50-100 mg by mouth at bedtime.   0   No current facility-administered medications for this visit.    SURGICAL HISTORY:  Past Surgical History:  Procedure Laterality Date  . ABLATION     for graves dX  . COLONOSCOPY    . dental implant    . eye compression Bilateral    due to graves disease done at [facility]   . EYE SURGERY    . PARATHYROIDECTOMY    . VIDEO ASSISTED THORACOSCOPY (VATS)/  LOBECTOMY Left 08/13/2017   Procedure: VIDEO ASSISTED THORACOSCOPY (VATS)/ LOBECTOMY;  Surgeon: Melrose Nakayama, MD;  Location: [facility];  Service: Thoracic;  Laterality: Left;    REVIEW OF SYSTEMS:  Constitutional: negative Eyes: negative Ears, nose, mouth, throat, and face: negative Respiratory: positive for dyspnea on exertion Cardiovascular: negative Gastrointestinal: negative Genitourinary:negative Integument/breast: negative Hematologic/lymphatic: negative Musculoskeletal:negative Neurological: negative Behavioral/Psych: negative Endocrine: negative Allergic/Immunologic: negative   PHYSICAL EXAMINATION: General appearance: alert, cooperative and no distress Head: Normocephalic, without obvious abnormality, atraumatic Neck: no adenopathy, no JVD, supple, symmetrical, trachea midline and thyroid not enlarged, symmetric, no tenderness/mass/nodules Lymph nodes: Cervical, supraclavicular, and axillary nodes normal. Resp: clear to auscultation bilaterally Back: symmetric, no curvature. ROM normal. No CVA tenderness. Cardio: regular rate and rhythm, S1, S2 normal, no murmur, click, rub or gallop GI: soft, non-tender; bowel sounds normal; no masses,  no organomegaly Extremities: extremities normal, atraumatic, no cyanosis or edema Neurologic: Alert and oriented X 3, normal strength and tone. Normal symmetric reflexes. Normal coordination and gait  ECOG PERFORMANCE STATUS: 1 - Symptomatic but completely ambulatory  Blood pressure (!) 152/79, pulse 90, temperature (!) 97.3 F (36.3 C), temperature source Tympanic, resp. rate 18, height 5' (1.524 m), weight 127 lb 14.4 oz (58 kg), SpO2 100 %.  LABORATORY DATA: Lab Results  Component Value Date   WBC 8.4 05/03/2020   HGB 12.7 05/03/2020   HCT 39.5 05/03/2020   MCV 86.4 05/03/2020   PLT 296 05/03/2020      Chemistry      Component Value Date/Time   NA 138 05/03/2020 1133   K 3.7 05/03/2020 1133   CL 103 05/03/2020 1133    CO2 28 05/03/2020 1133   BUN 14 05/03/2020 1133   CREATININE 0.81 05/03/2020 1133      Component Value Date/Time   CALCIUM 9.5 05/03/2020 1133   ALKPHOS 122 05/03/2020 1133   AST 23 05/03/2020 1133   ALT 19 05/03/2020 1133   BILITOT 0.3 05/03/2020 1133       RADIOGRAPHIC STUDIES: CT BIOPSY  Result Date: Apr 17, 2020 INDICATION: History of lung cancer, now with new bilateral pulmonary nodules worrisome for metastatic disease. Please perform CT-guided biopsy of dominant right lower lobe pulmonary nodule for tissue diagnostic purposes. EXAM: CT-GUIDED BIOPSY OF RIGHT LOWER LOBE PULMONARY NODULE COMPARISON:  PET-CT-03/25/2020; 03/16/2020; 06/27/2019 MEDICATIONS: None. ANESTHESIA/SEDATION: Fentanyl 50 mcg IV; Versed 1 mg IV Sedation time: 18 minutes; The patient was continuously monitored during the procedure by the interventional radiology nurse under my direct supervision. CONTRAST:  None COMPLICATIONS: None immediate. PROCEDURE: Informed consent was obtained from the patient following an explanation of the procedure, risks, benefits and alternatives. The patient understands,agrees and consents for the procedure. All questions were addressed. A time out was performed prior to the initiation of the procedure. The patient was positioned prone on the CT table and a limited chest CT was performed for procedural planning demonstrating unchanged size and appearance of the dominant approximately 0.7 cm right lower  lobe pulmonary nodule (image 50, series 2). The operative site was prepped and draped in the usual sterile fashion. Under sterile conditions and local anesthesia, a 17 gauge coaxial needle was advanced into the peripheral aspect of the nodule. Positioning was confirmed with intermittent CT fluoroscopy and followed by the acquisition of 3 core needle biopsies with an 18 gauge core needle biopsy device. The coaxial needle was removed following deployment of a [employer] plug and superficial hemostasis  was achieved with manual compression. Limited post procedural chest CT was negative for pneumothorax or additional complication. A dressing was placed. The patient tolerated the procedure well without immediate postprocedural complication. The patient was escorted to have an upright chest radiograph. IMPRESSION: Technically successful CT guided core needle core biopsy of dominant right lower lobe pulmonary nodule. Electronically Signed   By: Sandi Mariscal M.D.   On: 04/08/2020 13:17   DG Chest Port 1 View  Result Date: 04/08/2020 CLINICAL DATA:  Status post right lung biopsy EXAM: PORTABLE CHEST 1 VIEW COMPARISON:  10/16/2017 FINDINGS: Cardiac shadow is within normal limits. Aortic calcifications are seen. Postsurgical changes are noted on the left and stable. Lungs are well aerated without evidence of post biopsy pneumothorax. No bony abnormality is seen. IMPRESSION: No evidence of post biopsy pneumothorax. Electronically Signed   By: Inez Catalina M.D.   On: 04/08/2020 13:29    ASSESSMENT AND PLAN: This is a 69 years old white female recently diagnosed with a stage IIA (T1a, N1, M0) non-small cell lung cancer, adenocarcinoma with no actionable mutations.  She is status post wedge resection of the left upper lobe nodule with lingular segmentectomy and mediastinal lymph node sampling under the care of Dr. Roxan Hockey on August 13, 2017. The patient declined adjuvant systemic chemotherapy.  The patient had evidence for disease recurrence and metastatic bilateral pulmonary nodule.  This was biopsy-proven from biopsy of her right lower lobe pulmonary nodule and it was consistent with adenocarcinoma.  The [facility] block [facility] for molecular studies and PD-L1 expression to identify any actionable mutation on the new biopsy. The patient had a lot of questions about her condition and whether she need more biopsies from other nodules to confirm that they are cancer too.  I had a lengthy discussion  with the patient and her husband about her condition and potential treatment options.  She definitely declined to consider any future chemotherapy. I discussed with her the option of palliative care and hospice referral versus treatment with targeted therapy if she has an actionable mutation versus consideration of treatment with single agent immunotherapy if the PD-L1 expression is over 50% versus a combination of chemotherapy and immunotherapy. The molecular studies are still pending and expected to be available on May 13, 2020. I recommended for the patient to complete the staging work-up by ordering MRI of the brain to rule out brain metastasis.  She was also reluctant about doing this procedure but finally accepted it. I will see her back on May 18, 2020 for evaluation and more detailed discussion of her treatment options based on the molecular studies and the MRI results. I spent a lot of time explaining to the patient her condition and I reviewed the imaging studies with her again. She was advised to call if she has any concerning symptoms in the interval. The patient voices understanding of current disease status and treatment options and is in agreement with the current care plan.  All questions were answered. The patient knows to call  the clinic with any problems, questions or concerns. We can certainly see the patient much sooner if necessary.  Disclaimer: This note was dictated with voice recognition software. Similar sounding words can inadvertently be transcribed and may not be corrected upon review.

## 2020-05-04 ENCOUNTER — Encounter: Payer: Self-pay | Admitting: Internal Medicine

## 2020-05-08 DIAGNOSIS — C3491 Malignant neoplasm of unspecified part of right bronchus or lung: Secondary | ICD-10-CM | POA: Diagnosis not present

## 2020-05-10 ENCOUNTER — Telehealth: Payer: Self-pay | Admitting: Internal Medicine

## 2020-05-10 NOTE — Telephone Encounter (Signed)
Scheduled per los. Called and spoke with patient. Confirmed appt 

## 2020-05-11 ENCOUNTER — Encounter (HOSPITAL_COMMUNITY): Payer: Self-pay

## 2020-05-11 NOTE — Progress Notes (Signed)
Rachael Villanueva  Leeroy Cha, MD [address] [place] [address]  DIAGNOSIS:  1) recurrent and metastatic non-small cell lung cancer, adenocarcinoma initially diagnosed as stage IIA(T1a, N1, M0) non-small cell lung cancer, adenocarcinoma presented with left upper lobe small pulmonary nodules as well as metastatic adenocarcinoma to level 12L, diagnosed in February 2019.  The patient had disease recurrence with bilateral pulmonary nodules and biopsy-proven RLL nodule in September 2021. 2) acute pulmonary embolism incidentally diagnosed on CT scan of the chest on 12/24/2018 involving right lower lobe pulmonary artery segmental branches.  Biomarker Findings Microsatellite status - Cannot Be Determined Tumor Mutational Burden - Cannot Be Determined Genomic Findings For a complete list of the genes assayed, please refer to the Appendix. KRAS G12V 7 Disease relevant genes with no reportable alterations: EGFR, ALK, BRAF, MET, RET, ERBB2, ROS1  PDL1 expression 1%.  Repeat Molecular Studies from November 2021:  PRIOR THERAPY: Status post wedge resection of the left upper lobe nodule with thoracic lingular segmentectomy and mediastinal lymph node sampling under the care of Dr. Roxan Hockey on August 13, 2017.  CURRENT THERAPY:  1) The patient will call us regarding her final decision regarding treatment 2) Xarelto 15 mg p.o. twice daily for 3 weeks followed by 20 mg p.o. daily 20 mg p.o. daily.  She started the first dose on 12/25/2018.  INTERVAL HISTORY: Rachael Villanueva 69 y.o. female returns to the clinic today for a follow up visit. The patient is accompanied by her husband. The patient recently had evidence of disease progression with bilateral pulmonary nodules. Dr. Roxan Hockey ordered a CT guided core biopsy of the right lower lobe pulmonary nodules. The pathology was consistent with adenocarcinoma. The biopsy was sent for foundation  1 and PDL1 expression to determine what treatment options the patient is a candidate for.   Overall, the patient is feeling stressed and anxious about her condition. She denies any fever, chills, night sweats, or weight loss. She denies any chest pain, shortness of breath, cough, or hemoptysis. She denies any nausea, vomiting, diarrhea, or constipation. She denies any headaches or visual changes. She recently had a brain MRI performed to complete the staging work up which was negative for metastatic disease to the brain. She is here for evaluation and to review the results of her molecular studies and for a more detailed discussion about her current condition and treatment options.      MEDICAL HISTORY: Past Medical History:  Diagnosis Date  . Anxiety   . Depression   . Graves' disease   . Hypercalcemia   . Hypothyroidism    s/p post ablation for Graves Disease  . Lung nodule   . Sleep disorder   . Thyroid disease   . Vitamin D deficiency     ALLERGIES:  is allergic to grass pollen([id number]-[id number]-swt vern), molds & smuts, and other.  MEDICATIONS:  Current Outpatient Medications  Medication Sig Dispense Refill  . ALPRAZolam (XANAX) 0.5 MG tablet Take 0.25-0.5 mg by mouth at bedtime as needed for anxiety or sleep.     . Armodafinil 250 MG tablet Take 250 mg by mouth daily.    Marland Kitchen escitalopram (LEXAPRO) 10 MG tablet Take 20 mg by mouth daily.     Marland Kitchen levothyroxine (SYNTHROID) 125 MCG tablet Take 125 mcg by mouth as directed. NO tablet on sundays and 1 tablet rest of the week    . traZODone (DESYREL) 50 MG tablet Take 50-100 mg by mouth at  bedtime.   0   No current facility-administered medications for this visit.    SURGICAL HISTORY:  Past Surgical History:  Procedure Laterality Date  . ABLATION     for graves dX  . COLONOSCOPY    . dental implant    . eye compression Bilateral    due to graves disease done at [facility]   . EYE SURGERY    . PARATHYROIDECTOMY    . VIDEO ASSISTED THORACOSCOPY  (VATS)/ LOBECTOMY Left 08/13/2017   Procedure: VIDEO ASSISTED THORACOSCOPY (VATS)/ LOBECTOMY;  Surgeon: Melrose Nakayama, MD;  Location: [facility] OR;  Service: Thoracic;  Laterality: Left;    REVIEW OF SYSTEMS:   Review of Systems  Constitutional: Negative for appetite change, chills, fatigue, fever and unexpected weight change.  HENT: Negative for mouth sores, nosebleeds, sore throat and trouble swallowing.   Eyes: Negative for eye problems and icterus.  Respiratory: Negative for cough, hemoptysis, shortness of breath and wheezing.   Cardiovascular: Negative for chest pain and leg swelling.  Gastrointestinal: Negative for abdominal pain, constipation, diarrhea, nausea and vomiting.  Genitourinary: Negative for bladder incontinence, difficulty urinating, dysuria, frequency and hematuria.   Musculoskeletal: Negative for back pain, gait problem, neck pain and neck stiffness.  Skin: Negative for itching and rash.  Neurological: Negative for dizziness, extremity weakness, gait problem, headaches, light-headedness and seizures.  Hematological: Negative for adenopathy. Does not bruise/bleed easily.  Psychiatric/Behavioral: Positive for anxious. Negative for confusion, depression and sleep disturbance.    PHYSICAL EXAMINATION:  Blood pressure (!) 166/91, pulse 84, temperature 97.7 F (36.5 C), temperature source Tympanic, resp. rate 18, height 5' (1.524 m), weight 128 lb (58.1 kg), SpO2 100 %.  ECOG PERFORMANCE STATUS: 1 - Symptomatic but completely ambulatory  Physical Exam  Constitutional: Oriented to person, place, and time and well-developed, well-nourished, and in no distress.   HENT:  Head: Normocephalic and atraumatic.  Mouth/Throat: Oropharynx is clear and moist. No oropharyngeal exudate.  Eyes: Conjunctivae are normal. Right eye exhibits no discharge. Left eye exhibits no discharge. No scleral icterus.  Neck: Normal range of motion. Neck supple.  Cardiovascular: Normal rate, regular  rhythm, normal heart sounds and intact distal pulses.   Pulmonary/Chest: Effort normal and breath sounds normal. No respiratory distress. No wheezes. No rales.  Abdominal: Soft. Bowel sounds are normal. Exhibits no distension and no mass. There is no tenderness.  Musculoskeletal: Normal range of motion. Exhibits no edema.  Lymphadenopathy:    No cervical adenopathy.  Neurological: Alert and oriented to person, place, and time. Exhibits normal muscle tone. Gait normal. Coordination normal.  Skin: Skin is warm and dry. No rash noted. Not diaphoretic. No erythema. No pallor.  Psychiatric: Positive for anxiousness. Mood, memory and judgment normal.  Vitals reviewed.  LABORATORY DATA: Lab Results  Component Value Date   WBC 6.1 05/19/2020   HGB 12.5 05/19/2020   HCT 39.6 05/19/2020   MCV 87.4 05/19/2020   PLT 265 05/19/2020      Chemistry      Component Value Date/Time   NA 138 05/19/2020 0930   K 4.0 05/19/2020 0930   CL 102 05/19/2020 0930   CO2 29 05/19/2020 0930   BUN 12 05/19/2020 0930   CREATININE 0.82 05/19/2020 0930      Component Value Date/Time   CALCIUM 9.3 05/19/2020 0930   ALKPHOS 116 05/19/2020 0930   AST 21 05/19/2020 0930   ALT 16 05/19/2020 0930   BILITOT 0.3 05/19/2020 0930       RADIOGRAPHIC STUDIES:  MR BRAIN W WO CONTRAST  Result Date: 05/13/2020 CLINICAL DATA:  Non-small cell lung cancer, staging. EXAM: MRI HEAD WITHOUT AND WITH CONTRAST TECHNIQUE: Multiplanar, multiecho pulse sequences of the brain and surrounding structures were obtained without and with intravenous contrast. CONTRAST:  82m GADAVIST GADOBUTROL 1 MMOL/ML IV SOLN COMPARISON:  None. FINDINGS: Brain: No acute infarction, hemorrhage, hydrocephalus, extra-axial collection or mass lesion. Scattered foci of T2 hyperintensity are seen within the white matter of the cerebral hemispheres and within the pons, nonspecific, most likely related to chronic small vessel ischemia. Mild parenchymal volume  loss. No focus of abnormal contrast enhancement. Vascular: Normal flow voids. Skull and upper cervical spine: Normal marrow signal. Sinuses/Orbits: Prominent mucosal thickening with fluid level within the right maxillary sinus. Mild mucosal thickening throughout the remainder paranasal sinuses. The orbits are maintained. IMPRESSION: 1. No evidence of intracranial metastatic disease. 2. Scattered foci of T2 hyperintensity within the white matter of the cerebral hemispheres and pons, nonspecific, most likely related to chronic small vessel ischemia. 3. Acute right maxillary sinusitis. Electronically Signed   By: KPedro EarlsM.D.   On: 05/13/2020 20:06     ASSESSMENT/PLAN:  This is a 69year old Caucasian female diagnosed with a stage IIA (T1a, N1, M0) non-small cell lung cancer, adenocarcinoma with no actionable mutations.  She is status post wedge resection of the left upper lobe nodule with lingular segmentectomy and mediastinal lymph node sampling under the care of Dr. HRoxan Hockeyon August 13, 2017.  The patient declined adjuvant systemic chemotherapy at that time.   The patient had evidence of disease recurrence with metastatic bilateral pulmonary nodules. This was biopsy-proven from biopsy of her right lower lobe pulmonary nodule and it was consistent with adenocarcinoma.  The tissue block was sent to foundation 1 for molecular studies and PD-L1 expression to identify any actionable mutation on the new biopsy. The results showed no actionable mutations. Her PDL1 expression is 1%  The patient was seen with Dr. MJulien Nordmann Dr. MJulien Nordmannhad a lengthly discussion with the patient today about her current condition and treatment options. She is not a candidate for targeted therapy. The patient was given the option of a referral to hospice/palliative vs. Treatment with systemic chemotherapy with carboplatin for an AUC of 5, Alimta 500 mg/m, and Keytruda 200 mg IV every 3 weeks. Dr. MJulien Nordmann also discussed that due to her PDL1 expression is 1% that she may not be a good candidate for single agent Keytruda but she was given the option. She was also given the option of immunotherapy with ipilimumab and Opdivo IV every 3 weeks. The patient has a history of autoimmune disease and thyroid dysfunction. Dr. MJulien Nordmanndiscussed that immunotherapy may exacerbate that. The patient would like more time to consider her options before making a decision. She may elect to have a second opinion at another institution, possibly [facility]   Dr. MJulien Nordmanndiscussed that treatment would be palliative in nature. The adverse side effects of treatment were discussed for each of the options. She was also given an educational handout of the treatment medications.   I will not arrange for any follow up appointments at this time. She will call [place] she has made her decision and we will facilitate any prescriptions, appointments, referrals, patient education, etc that may be required.   She was given a copy of her foundation one results.   The patient was advised to call immediately if she has any concerning symptoms in the interval.  The patient voices understanding of current disease status and treatment options and is in agreement with the current care plan. All questions were answered. The patient knows to call the clinic with any problems, questions or concerns. We can certainly see the patient much sooner if necessary  No orders of the defined types were placed in this encounter.    Ula Couvillon L Maximum Reiland, PA-C 05/19/20  ADDENDUM: Hematology/Oncology Attending: I had a face-to-face encounter with the patient today.  I recommended her care plan.  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  She has a history of a stage IIa (T1 a, N1, M0) non-small cell lung cancer, adenocarcinoma status post  left upper lobe wedge resection and segmentectomy with lymph node dissection in February 2019.  The patient declined adjuvant systemic chemotherapy at that time. The molecular studies after the surgical resection was negative for any actionable mutation.  Repeat molecular studies from the recent biopsy by foundation 1 showed no actionable mutation and PD-L1 expression of 1%. I had a lengthy discussion with the patient and her husband today about her current condition and treatment options.  I discussed with the patient several options for treatment of her condition.  She understands that she has incurable condition and all the treatment will be of palliative nature. She was giving the option of palliative care versus palliative combination of systemic chemotherapy with carboplatin for AUC of 5, Alimta 500 mg/M2 and Keytruda 200 mg IV every 3 weeks for the induction 4 cycles followed by maintenance treatment with Alimta and Keytruda versus treatment with immunotherapy alone either with single agent Keytruda or a combination of ipilimumab and nivolumab. I discussed with the patient and her husband in detail the adverse effect as well as prognosis with and without any of these treatment. The patient and her husband would like to take some time to think about their options before making a decision.  I also offered the patient a second opinion at one of the adjacent tertiary center but she is also thinking about going to [facility] in [place]. The patient will call with her decision and will be happy to start her treatment as soon as possible. She was advised to call if she has any concerning symptoms in the interval.  Disclaimer: This Villanueva was dictated with voice recognition software. Similar sounding words can inadvertently be transcribed and may be missed upon review. Eilleen Kempf, MD 05/19/20

## 2020-05-12 ENCOUNTER — Encounter: Payer: Self-pay | Admitting: Internal Medicine

## 2020-05-13 ENCOUNTER — Other Ambulatory Visit: Payer: Self-pay

## 2020-05-13 ENCOUNTER — Ambulatory Visit (HOSPITAL_COMMUNITY)
Admission: RE | Admit: 2020-05-13 | Discharge: 2020-05-13 | Disposition: A | Discharge status: Home / Self Care | Payer: Medicare HMO | Source: Ambulatory Visit | Attending: Internal Medicine | Admitting: Internal Medicine

## 2020-05-13 DIAGNOSIS — C3490 Malignant neoplasm of unspecified part of unspecified bronchus or lung: Secondary | ICD-10-CM | POA: Diagnosis not present

## 2020-05-13 DIAGNOSIS — J3489 Other specified disorders of nose and nasal sinuses: Secondary | ICD-10-CM | POA: Diagnosis not present

## 2020-05-13 DIAGNOSIS — J0101 Acute recurrent maxillary sinusitis: Secondary | ICD-10-CM | POA: Diagnosis not present

## 2020-05-13 MED ORDER — GADOBUTROL 1 MMOL/ML IV SOLN
5.0000 mL | Freq: Once | INTRAVENOUS | Status: AC | PRN
  Administered 2020-05-13: 5 mL via INTRAVENOUS

## 2020-05-16 ENCOUNTER — Encounter: Payer: Self-pay | Admitting: Internal Medicine

## 2020-05-19 ENCOUNTER — Inpatient Hospital Stay: Payer: Medicare HMO

## 2020-05-19 ENCOUNTER — Inpatient Hospital Stay: Payer: Medicare HMO | Attending: Internal Medicine | Admitting: Physician Assistant

## 2020-05-19 ENCOUNTER — Other Ambulatory Visit: Payer: Self-pay

## 2020-05-19 VITALS — BP 166/91 | HR 84 | Temp 97.7°F | Resp 18 | Ht 60.0 in | Wt 128.0 lb

## 2020-05-19 DIAGNOSIS — Z902 Acquired absence of lung [part of]: Secondary | ICD-10-CM | POA: Insufficient documentation

## 2020-05-19 DIAGNOSIS — Z7189 Other specified counseling: Secondary | ICD-10-CM | POA: Diagnosis not present

## 2020-05-19 DIAGNOSIS — C7801 Secondary malignant neoplasm of right lung: Secondary | ICD-10-CM | POA: Insufficient documentation

## 2020-05-19 DIAGNOSIS — C3492 Malignant neoplasm of unspecified part of left bronchus or lung: Secondary | ICD-10-CM | POA: Diagnosis not present

## 2020-05-19 DIAGNOSIS — C3412 Malignant neoplasm of upper lobe, left bronchus or lung: Secondary | ICD-10-CM | POA: Diagnosis present

## 2020-05-19 DIAGNOSIS — C3490 Malignant neoplasm of unspecified part of unspecified bronchus or lung: Secondary | ICD-10-CM

## 2020-05-19 LAB — CBC WITH DIFFERENTIAL (CANCER CENTER ONLY)
Abs Immature Granulocytes: 0.01 10*3/uL (ref 0.00–0.07)
Basophils Absolute: 0 10*3/uL (ref 0.0–0.1)
Basophils Relative: 1 %
Eosinophils Absolute: 0.3 10*3/uL (ref 0.0–0.5)
Eosinophils Relative: 5 %
HCT: 39.6 % (ref 36.0–46.0)
Hemoglobin: 12.5 g/dL (ref 12.0–15.0)
Immature Granulocytes: 0 %
Lymphocytes Relative: 35 %
Lymphs Abs: 2.1 10*3/uL (ref 0.7–4.0)
MCH: 27.6 pg (ref 26.0–34.0)
MCHC: 31.6 g/dL (ref 30.0–36.0)
MCV: 87.4 fL (ref 80.0–100.0)
Monocytes Absolute: 0.5 10*3/uL (ref 0.1–1.0)
Monocytes Relative: 8 %
Neutro Abs: 3.1 10*3/uL (ref 1.7–7.7)
Neutrophils Relative %: 51 %
Platelet Count: 265 10*3/uL (ref 150–400)
RBC: 4.53 MIL/uL (ref 3.87–5.11)
RDW: 15 % (ref 11.5–15.5)
WBC Count: 6.1 10*3/uL (ref 4.0–10.5)
nRBC: 0 % (ref 0.0–0.2)

## 2020-05-19 LAB — CMP (CANCER CENTER ONLY)
ALT: 16 U/L (ref 0–44)
AST: 21 U/L (ref 15–41)
Albumin: 4.3 g/dL (ref 3.5–5.0)
Alkaline Phosphatase: 116 U/L (ref 38–126)
Anion gap: 7 (ref 5–15)
BUN: 12 mg/dL (ref 8–23)
CO2: 29 mmol/L (ref 22–32)
Calcium: 9.3 mg/dL (ref 8.9–10.3)
Chloride: 102 mmol/L (ref 98–111)
Creatinine: 0.82 mg/dL (ref 0.44–1.00)
GFR, Estimated: >60 mL/min (ref >60)
Glucose, Bld: 110 mg/dL — ABNORMAL HIGH (ref 70–99)
Potassium: 4 mmol/L (ref 3.5–5.1)
Sodium: 138 mmol/L (ref 135–145)
Total Bilirubin: 0.3 mg/dL (ref 0.3–1.2)
Total Protein: 7.2 g/dL (ref 6.5–8.1)

## 2020-05-19 NOTE — Patient Instructions (Addendum)
Summary:  -There are two main categories of lung cancer, they are named based on the size of the cancer cell. One is called Non-Small cell lung cancer. The other type is Small Cell Lung Cancer -The sample (biopsy) that they took of your tumor was consistent with a subtype of Non-small cell lung cancer called Adenocarcinoma. This is the most common type of lung cancer.  -We covered a lot of important information at your appointment today regarding what the treatment plan is moving forward. Here are the the main points that were discussed at your office visit with [place] today:  -The treatment that you will receive consists of two chemotherapy drugs, called Carboplatin and Alimta (also called Pemetrexed) and one immunotherapy drug called Keytruda (pembrolizumab).  -Before your start your treatment, I would like you to attend a Chemotherapy Education Class. This involves having you sit down with one of our nurse educators. She will discuss with your one-on-one more details about your treatment as well as general information about resources here at the cancer center.  -Your treatment will be given once every 3 weeks. We will check your labs once a week for the first ~5 treatments just to make sure that important components of your blood are in an acceptable range -We will get a CT scan after 3 treatments to check on the progress of treatment  Medications:  -I have sent a few important medication prescriptions to your pharmacy.  -Compazine was sent to your pharmacy. This medication is for nausea. You may take this every 6 hours as needed if you feel nauseous.  -I have also sent a prescription for 1 mg of folic acid to your pharmacy. We need you to take 1 tablet every day.  -We will administer vitamin B12 every 9 weeks while you are here in the clinic. You have received your first dose today.   Referrals or Imaging: -Let us know if you want [place] to send your records to another institution for a second opinion.    Follow up:  -If you need to reach [place] at any time, the main office number to the cancer center is [phone], when you call, ask to speak to either Cassie's or Dr. Worthy Flank nurse. Please call us and let us know what your decision is.   Other options: -The other option is two immunotherapy drugs, called nivolumab and Yervoy. This is given every 3 weeks. This is also given as an IV medication.   -The third option is immunotherapy with Keytuda alone, however, that is likely not the most effective option to use this alone due to the results of your foundation one testing.   Ipilimumab injection Curt Bears) What is this medicine? IPILIMUMAB (IP i LIM ue mab) is a monoclonal antibody. It is used to treat colorectal cancer, kidney cancer, liver cancer, lung cancer, melanoma, and mesothelioma. This medicine may be used for other purposes; ask your health care provider or pharmacist if you have questions. COMMON BRAND NAME(S): YERVOY What should I tell my health care provider before I take this medicine? They need to know if you have any of these conditions:  Addison's disease  blood in your stools (black or tarry stools) or if you have blood in your vomit  eye disease, vision problems  history of pancreatitis  history of stomach bleeding  immune system problems  inflammatory bowel disease  kidney disease  liver disease  lupus  myasthenia gravis  organ transplant  rheumatoid arthritis  sarcoidosis  stomach or intestine  problems  thyroid disease  tingling of the fingers or toes, or other nerve disorder  an unusual or allergic reaction to ipilimumab, other medicines, foods, dyes, or preservatives  pregnant or trying to get pregnant  breast-feeding How should I use this medicine? This medicine is for infusion into a vein. It is given by a health care professional in a hospital or clinic setting. A special MedGuide will be given to you before each treatment. Be sure to  read this information carefully each time. Talk to your pediatrician regarding the use of this medicine in children. While this drug may be prescribed for children as young as 12 years for selected conditions, precautions do apply. Overdosage: If you think you have taken too much of this medicine contact a poison control center or emergency room at once. NOTE: This medicine is only for you. Do not share this medicine with others. What if I miss a dose? It is important not to miss your dose. Call your doctor or health care professional if you are unable to keep an appointment. What may interact with this medicine? Interactions are not expected. This list may not describe all possible interactions. Give your health care provider a list of all the medicines, herbs, non-prescription drugs, or dietary supplements you use. Also tell them if you smoke, drink alcohol, or use illegal drugs. Some items may interact with your medicine. What should I watch for while using this medicine? Tell your doctor or healthcare professional if your symptoms do not start to get better or if they get worse. Do not become pregnant while taking this medicine or for 3 months after stopping it. Women should inform their doctor if they wish to become pregnant or think they might be pregnant. There is a potential for serious side effects to an unborn child. Talk to your health care professional or pharmacist for more information. Do not breast-feed an infant while taking this medicine or for 3 months after the last dose. Your condition will be monitored carefully while you are receiving this medicine. You may need blood work done while you are taking this medicine. What side effects may I notice from receiving this medicine? Side effects that you should report to your doctor or health care professional as soon as possible:  allergic reactions like skin rash, itching or hives, swelling of the face, lips, or tongue  black, tarry  stools  bloody or watery diarrhea  changes in vision  dizziness  eye pain  fast, irregular heartbeat  feeling anxious  feeling faint or lightheaded, falls  nausea, vomiting  pain, tingling, numbness in the hands or feet  redness, blistering, peeling or loosening of the skin, including inside the mouth  signs and symptoms of liver injury like dark yellow or brown urine; general ill feeling or flu-like symptoms; light-colored stools; loss of appetite; nausea; right upper belly pain; unusually weak or tired; yellowing of the eyes or skin  unusual bleeding or bruising Side effects that usually do not require medical attention (report to your doctor or health care professional if they continue or are bothersome):  headache  loss of appetite  trouble sleeping This list may not describe all possible side effects. Call your doctor for medical advice about side effects. You may report side effects to FDA at 1-800-FDA-1088. Where should I keep my medicine? This drug is given in a hospital or clinic and will not be stored at home. NOTE: This sheet is a summary. It may not cover all  possible information. If you have questions about this medicine, talk to your doctor, pharmacist, or health care provider.  2020 Elsevier/Gold Standard (2019-04-15 10:56:55)

## 2020-05-20 ENCOUNTER — Encounter: Payer: Self-pay | Admitting: Physician Assistant

## 2020-06-09 ENCOUNTER — Telehealth: Payer: Self-pay | Admitting: *Deleted

## 2020-06-09 NOTE — Telephone Encounter (Signed)
I called Ms. Sheahan to check on her and where she would like to get her treatment. I was unable to reach her but did leave a vm message with my name and phone number to call.

## 2020-06-22 ENCOUNTER — Telehealth: Payer: Self-pay | Admitting: *Deleted

## 2020-06-22 NOTE — Telephone Encounter (Signed)
I reached out to Ms. Memoli today to check on her decision regarding treatment.  I was unable to reach but did leave vm message with my name and phone number to call.

## 2020-06-25 ENCOUNTER — Telehealth: Payer: Self-pay | Admitting: Internal Medicine

## 2020-06-25 NOTE — Telephone Encounter (Signed)
Patient stated that she have been taking Brand name Synthroid 112 mcg that have been prescribed by Dr Fara Olden since 09/16/2019. Patient stated that she did not released this until now. Patient does not believe she have any of  the levothyroxine 125 mcg.   Patient is stress out that she believe this why she have been feeling so bad, please advise.

## 2020-06-25 NOTE — Telephone Encounter (Signed)
Patient requests to be called asap at ph# [phone] re: Levothyroxine dosage (Patient states she has been 112 mcg).

## 2020-06-29 ENCOUNTER — Telehealth: Payer: Self-pay | Admitting: *Deleted

## 2020-06-29 ENCOUNTER — Telehealth: Payer: Self-pay | Admitting: Medical Oncology

## 2020-06-29 NOTE — Telephone Encounter (Signed)
Patient called Diane RN and the patient needs a call back. I called but could not reach. I did leave a vm message with my name and phone number to call.

## 2020-06-29 NOTE — Telephone Encounter (Signed)
I called patient but was unable to reach. I did leave vm message with my name and phone number to call.

## 2020-06-29 NOTE — Telephone Encounter (Signed)
Spoken to patient and inform her that Dr Kelton Pillar is out of the office until 07/12/2020. Patient stated that she is due for labs so we have schedule her lab on 07/20/2020. We can make changes then. Patient stated that she must have grab the wrong bottle. She have it correct now.

## 2020-06-29 NOTE — Telephone Encounter (Signed)
S said she missed 2 calls and wants to talk to someone who can "discuss my current situation"

## 2020-07-07 ENCOUNTER — Other Ambulatory Visit: Payer: Self-pay | Admitting: Internal Medicine

## 2020-07-07 DIAGNOSIS — E890 Postprocedural hypothyroidism: Secondary | ICD-10-CM

## 2020-07-07 NOTE — Telephone Encounter (Signed)
Thyroid labs entered for 1/11 lab appointment

## 2020-07-12 ENCOUNTER — Telehealth: Payer: Self-pay | Admitting: *Deleted

## 2020-07-12 NOTE — Telephone Encounter (Signed)
Per Dr. Julien Nordmann, I called Rachael Villanueva to inquire about her plan of care. I called but was unable to reach. I did leave vm message with name and phone number to call.

## 2020-07-20 ENCOUNTER — Other Ambulatory Visit: Payer: Self-pay

## 2020-07-20 ENCOUNTER — Other Ambulatory Visit (INDEPENDENT_AMBULATORY_CARE_PROVIDER_SITE_OTHER): Payer: Medicare HMO

## 2020-07-20 ENCOUNTER — Telehealth: Payer: Self-pay | Admitting: Internal Medicine

## 2020-07-20 ENCOUNTER — Encounter: Payer: Self-pay | Admitting: Internal Medicine

## 2020-07-20 DIAGNOSIS — E890 Postprocedural hypothyroidism: Secondary | ICD-10-CM

## 2020-07-20 LAB — TSH: TSH: 9.74 u[IU]/mL — ABNORMAL HIGH (ref 0.35–4.50)

## 2020-07-20 LAB — T4, FREE: Free T4: 0.74 ng/dL (ref 0.60–1.60)

## 2020-07-20 MED ORDER — LEVOTHYROXINE SODIUM 112 MCG PO TABS: 112.0000 ug | ORAL_TABLET | Freq: Every day | ORAL | 3 refills | Status: DC

## 2020-07-20 NOTE — Telephone Encounter (Signed)
Can you please verify how much of levothyroxine she is taking ?   Her Thyroid showed a big bump in the numbers and I would like to verify how she is taking   ( She is supposed to be taking half a tablet on Sundays and one tablet rest of the week)   If she is taking it like above, please ask her to start taking it daily.   Thanks    Abby Nena Jordan, MD  [facility] Endocrinology  [facility] [address], [place] [address] Phone: [phone] FAX: [phone]

## 2020-07-20 NOTE — Telephone Encounter (Signed)
Spoken to patient and she is taking levothyroxine 125 mcg. She stated that she will pick up the 112 mcg. However, she does believe she should be taking Brand name Synthroid but will discuss in March. Patient is schedule on 09/18/2019 and I try to schedule later but she stated she has things to do.

## 2020-07-20 NOTE — Telephone Encounter (Addendum)
Spoken to patient. Patient stated that she is taking 1 tablet every day except on Sunday which does not take any. Patient stated there is not way you a split that tiny pill so she decided not to take it at all on Sunday.  Patient is taking levothyroxine 125 mcg

## 2020-07-20 NOTE — Telephone Encounter (Signed)
Well, skipping a full tablet on sundays is clearly not enough. Please verify the dose of the actual levothyroxine being 125 mcg . If this is correct , then I will send a new prescription of the levothyroxine 112 mcg DAILY including SUndays, please postpone her visit with me from February to mid March, no labs until  her visit with me in March      Thanks

## 2020-07-21 ENCOUNTER — Telehealth: Payer: Self-pay | Admitting: Internal Medicine

## 2020-07-21 NOTE — Telephone Encounter (Signed)
Scheduled appt per 1/11 sch msg - pt is aware of apt date and time

## 2020-07-28 ENCOUNTER — Inpatient Hospital Stay: Payer: Medicare HMO | Attending: Internal Medicine | Admitting: Internal Medicine

## 2020-07-28 ENCOUNTER — Other Ambulatory Visit: Payer: Self-pay

## 2020-07-28 VITALS — BP 168/94 | HR 91 | Temp 96.4°F | Resp 16 | Ht 60.0 in | Wt 125.3 lb

## 2020-07-28 DIAGNOSIS — C3431 Malignant neoplasm of lower lobe, right bronchus or lung: Secondary | ICD-10-CM | POA: Insufficient documentation

## 2020-07-28 DIAGNOSIS — E039 Hypothyroidism, unspecified: Secondary | ICD-10-CM | POA: Diagnosis not present

## 2020-07-28 DIAGNOSIS — C3492 Malignant neoplasm of unspecified part of left bronchus or lung: Secondary | ICD-10-CM | POA: Diagnosis not present

## 2020-07-28 DIAGNOSIS — C3490 Malignant neoplasm of unspecified part of unspecified bronchus or lung: Secondary | ICD-10-CM

## 2020-07-28 DIAGNOSIS — Z85118 Personal history of other malignant neoplasm of bronchus and lung: Secondary | ICD-10-CM | POA: Diagnosis not present

## 2020-07-28 DIAGNOSIS — Z87891 Personal history of nicotine dependence: Secondary | ICD-10-CM | POA: Insufficient documentation

## 2020-07-28 NOTE — Progress Notes (Signed)
[facility] [phone]   [phone]  OFFICE PROGRESS NOTE  Leeroy Cha, MD [address] [place] [address]  DIAGNOSIS:  1) recurrent and metastatic non-small cell lung cancer, adenocarcinoma initially diagnosed as stage IIA (T1a, N1, M0) non-small cell lung cancer, adenocarcinoma presented with right upper lobe small pulmonary nodules as well as metastatic adenocarcinoma to level 12L, diagnosed in February 2019.  The patient had disease recurrence with bilateral pulmonary nodules and biopsy-proven in September 2021. 2) acute pulmonary embolism incidentally diagnosed on CT scan of the chest on 12/24/2018 involving right lower lobe pulmonary artery segmental branches.  Biomarker Findings Microsatellite status - Cannot Be Determined Tumor Mutational Burden - Cannot Be Determined Genomic Findings For a complete list of the genes assayed, please refer to the Appendix. KRAS G12V 7 Disease relevant genes with no reportable alterations: EGFR, ALK, BRAF, MET, RET, ERBB2, ROS1  PDL1 expression 5%.  PRIOR THERAPY: Status post wedge resection of the left upper lobe nodule with thoracic lingular segmentectomy and mediastinal lymph node sampling under the care of Dr. Roxan Hockey on August 13, 2017.  CURRENT THERAPY:  1) currently undergoing work-up for the newly metastatic non-small cell lung cancer.  2) Xarelto 15 mg p.o. twice daily for 3 weeks followed by 20 mg p.o. daily 20 mg p.o. daily.  She started the first dose on 12/25/2018.  INTERVAL HISTORY: Rachael Villanueva 70 y.o. female returns to the clinic today for follow-up visit accompanied by her husband.  The patient is feeling fine today with no concerning complaints except for fatigue.  She is dealing with thyroid issues with her primary care physician and endocrinologist.  She denied having any current chest pain, shortness of breath but has mild cough with no hemoptysis.   She denied having any fever or chills.  She has no nausea, vomiting, diarrhea or constipation.  She denied having any headache or visual changes.  She was last seen more than 2 months ago and she was supposed to reach out to [place] with her decision regarding treatment or not but she did not schedule any visits.  We had several calls for the patient to check on her and find out about her decision.  She requested a follow-up appointment today for more discussion of her options.  She also requested the pathology block to be sent to [facility] for a second opinion but she has not heard about it yet.  MEDICAL HISTORY: Past Medical History:  Diagnosis Date  . Anxiety   . Depression   . Graves' disease   . Hypercalcemia   . Hypothyroidism    s/p post ablation for Graves Disease  . Lung nodule   . Sleep disorder   . Thyroid disease   . Vitamin D deficiency     ALLERGIES:  is allergic to grass pollen(k-o-r-t-swt vern), molds & smuts, and other.  MEDICATIONS:  Current Outpatient Medications  Medication Sig Dispense Refill  . ALPRAZolam (XANAX) 0.5 MG tablet Take 0.25-0.5 mg by mouth at bedtime as needed for anxiety or sleep.     . Armodafinil 250 MG tablet Take 250 mg by mouth daily.    Marland Kitchen escitalopram (LEXAPRO) 10 MG tablet Take 20 mg by mouth daily.     Marland Kitchen levothyroxine (SYNTHROID) 112 MCG tablet Take 1 tablet (112 mcg total) by mouth daily. 90 tablet 3  . traZODone (DESYREL) 50 MG tablet Take 50-100 mg by mouth at bedtime.   0  No current facility-administered medications for this visit.    SURGICAL HISTORY:  Past Surgical History:  Procedure Laterality Date  . ABLATION     for graves dX  . COLONOSCOPY    . dental implant    . eye compression Bilateral    due to graves disease done at [facility]   . EYE SURGERY    . PARATHYROIDECTOMY    . VIDEO ASSISTED THORACOSCOPY (VATS)/ LOBECTOMY Left 08/13/2017   Procedure: VIDEO ASSISTED THORACOSCOPY (VATS)/ LOBECTOMY;  Surgeon:  Melrose Nakayama, MD;  Location: [facility] OR;  Service: Thoracic;  Laterality: Left;    REVIEW OF SYSTEMS:  Constitutional: positive for fatigue Eyes: negative Ears, nose, mouth, throat, and face: negative Respiratory: positive for cough Cardiovascular: negative Gastrointestinal: negative Genitourinary:negative Integument/breast: negative Hematologic/lymphatic: negative Musculoskeletal:negative Neurological: negative Behavioral/Psych: negative Endocrine: negative Allergic/Immunologic: negative   PHYSICAL EXAMINATION: General appearance: alert, cooperative, fatigued and no distress Head: Normocephalic, without obvious abnormality, atraumatic Neck: no adenopathy, no JVD, supple, symmetrical, trachea midline and thyroid not enlarged, symmetric, no tenderness/mass/nodules Lymph nodes: Cervical, supraclavicular, and axillary nodes normal. Resp: clear to auscultation bilaterally Back: symmetric, no curvature. ROM normal. No CVA tenderness. Cardio: regular rate and rhythm, S1, S2 normal, no murmur, click, rub or gallop GI: soft, non-tender; bowel sounds normal; no masses,  no organomegaly Extremities: extremities normal, atraumatic, no cyanosis or edema Neurologic: Alert and oriented X 3, normal strength and tone. Normal symmetric reflexes. Normal coordination and gait  ECOG PERFORMANCE STATUS: 1 - Symptomatic but completely ambulatory  Blood pressure (!) 168/94, pulse 91, temperature (!) 96.4 F (35.8 C), temperature source Tympanic, resp. rate 16, height 5' (1.524 m), weight 125 lb 4.8 oz (56.8 kg), SpO2 99 %.  LABORATORY DATA: Lab Results  Component Value Date   WBC 6.1 05/19/2020   HGB 12.5 05/19/2020   HCT 39.6 05/19/2020   MCV 87.4 05/19/2020   PLT 265 05/19/2020      Chemistry      Component Value Date/Time   NA 138 05/19/2020 0930   K 4.0 05/19/2020 0930   CL 102 05/19/2020 0930   CO2 29 05/19/2020 0930   BUN 12 05/19/2020 0930   CREATININE 0.82 05/19/2020 0930       Component Value Date/Time   CALCIUM 9.3 05/19/2020 0930   ALKPHOS 116 05/19/2020 0930   AST 21 05/19/2020 0930   ALT 16 05/19/2020 0930   BILITOT 0.3 05/19/2020 0930       RADIOGRAPHIC STUDIES: No results found.  ASSESSMENT AND PLAN: This is a 70 years old white female recently diagnosed with a stage IIA (T1a, N1, M0) non-small cell lung cancer, adenocarcinoma with no actionable mutations.  She is status post wedge resection of the left upper lobe nodule with lingular segmentectomy and mediastinal lymph node sampling under the care of Dr. Roxan Hockey on August 13, 2017. The patient declined adjuvant systemic chemotherapy.  The patient had evidence for disease recurrence and metastatic bilateral pulmonary nodule.  This was biopsy-proven from biopsy of her right lower lobe pulmonary nodule and it was consistent with adenocarcinoma.  [facility] for molecular studies and PD-L1 expression but again it showed no actionable mutation and PD-L1 expression was 1%. I had another lengthy discussion with the patient and her husband today about her current condition and treatment options.  I again gave the patient the option of palliative care and hospice referral versus consideration of palliative systemic chemotherapy with carboplatin, Alimta and Keytruda.  She understands  that she has incurable condition and all the treatment will be of palliative nature. The patient is still very reluctant about making any decision and she would like to have another scan performed in March 2022 for evaluation of her disease before making a decision.  She also requested the tissue block to be sent to [facility] for another pathology review.  She has not heard anything from them yet. I agreed with her plan and I will arrange for her to have repeat CT scan of the chest in 2 months with follow-up visit after the scan. For the hypothyroidism, she will continue with her  endocrinologist for adjustment of her medication. The patient was advised to call immediately if she has any other concerning symptoms in the interval. The patient voices understanding of current disease status and treatment options and is in agreement with the current care plan.  All questions were answered. The patient knows to call the clinic with any problems, questions or concerns. We can certainly see the patient much sooner if necessary.  Disclaimer: This note was dictated with voice recognition software. Similar sounding words can inadvertently be transcribed and may not be corrected upon review.

## 2020-08-02 ENCOUNTER — Encounter: Payer: Self-pay | Admitting: Internal Medicine

## 2020-08-02 ENCOUNTER — Telehealth: Payer: Self-pay | Admitting: Internal Medicine

## 2020-08-02 DIAGNOSIS — C3431 Malignant neoplasm of lower lobe, right bronchus or lung: Secondary | ICD-10-CM | POA: Diagnosis not present

## 2020-08-02 NOTE — Telephone Encounter (Signed)
Left message with upcoming appointments. Gave option to call back to reschedule if needed.

## 2020-08-11 ENCOUNTER — Encounter (HOSPITAL_COMMUNITY): Payer: Self-pay

## 2020-09-06 ENCOUNTER — Ambulatory Visit: Payer: Medicare HMO | Admitting: Internal Medicine

## 2020-09-17 ENCOUNTER — Other Ambulatory Visit: Payer: Self-pay

## 2020-09-17 ENCOUNTER — Ambulatory Visit (INDEPENDENT_AMBULATORY_CARE_PROVIDER_SITE_OTHER): Payer: Medicare HMO | Admitting: Internal Medicine

## 2020-09-17 ENCOUNTER — Encounter: Payer: Self-pay | Admitting: Internal Medicine

## 2020-09-17 VITALS — BP 136/80 | HR 90 | Ht 60.0 in | Wt 124.2 lb

## 2020-09-17 DIAGNOSIS — E890 Postprocedural hypothyroidism: Secondary | ICD-10-CM | POA: Diagnosis not present

## 2020-09-17 LAB — TSH: TSH: 5.79 u[IU]/mL — ABNORMAL HIGH (ref 0.35–4.50)

## 2020-09-17 NOTE — Progress Notes (Signed)
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 She has followed with [facility] since 11/19/2019 for consultative assistance with management of her hypothyroidism     HISTORICAL SUMMARY:   She is S/P RAI ablation secondary to Graves' Disease in 1996 . She has been on LT-4 replacement    She is s/p eye sx in [facility]   She is S/p parathyroidectomy in 1998 due to hypercalcemia.   Mother and brother with thyroid disease  SUBJECTIVE:     Today (09/17/2020):  [name] is here for postablative hypothyroidism . Since her last visit she was taking the wrong and lesser dose of LT-4 replacement but this has been corrected by 07/2020  Weight has been decreasing  Denies constipation  Denies sob but has cough that she attributes to allergy      Follows with Oncology for lung Ca    HISTORY:  Past Medical History:  Past Medical History:  Diagnosis Date  . Anxiety   . Depression   . Graves' disease   . Hypercalcemia   . Hypothyroidism    s/p post ablation for Graves Disease  . Lung nodule   . Sleep disorder   . Thyroid disease   . Vitamin D deficiency    Past Surgical History:  Past Surgical History:  Procedure Laterality Date  . ABLATION     for graves dX  . COLONOSCOPY    . dental implant    . eye compression Bilateral    due to graves disease done at [facility]   . EYE SURGERY    . PARATHYROIDECTOMY    . VIDEO ASSISTED THORACOSCOPY (VATS)/ LOBECTOMY Left 08/13/2017   Procedure: VIDEO ASSISTED THORACOSCOPY (VATS)/ LOBECTOMY;  Surgeon: Melrose Nakayama, MD;  Location: [facility];  Service: Thoracic;   Laterality: Left;    Social History:  reports that she quit smoking about 3 years ago. Her smoking use included cigarettes. She smoked 0.50 packs per day. She has never used smokeless tobacco. She reports that she does not drink alcohol and does not use drugs. Family History:  Family History  Problem Relation Age of Onset  . Thyroid disease Mother   . Lung cancer Mother   . Parkinson's disease Father   . Thyroid disease Brother      HOME MEDICATIONS: Allergies as of 09/17/2020      Reactions   Grass Pollen(k-o-r-t-swt Vern)    Molds & Smuts    Other    Seasonal allergies      Medication List       Accurate as of September 17, 2020  7:09 AM. If you have any questions, ask your nurse or doctor.        ALPRAZolam 0.5 MG tablet Commonly known as: XANAX Take 0.25-0.5 mg by mouth at bedtime as needed for anxiety or sleep.   Armodafinil 250 MG tablet Take 250 mg by mouth daily.   escitalopram 10 MG tablet Commonly known as: LEXAPRO Take 20 mg by mouth daily.   levothyroxine 112 MCG tablet Commonly known as: SYNTHROID Take 1 tablet (112 mcg total)  by mouth daily.   traZODone 50 MG tablet Commonly known as: DESYREL Take 50-100 mg by mouth at bedtime.         OBJECTIVE:   PHYSICAL EXAM: VS: There were no vitals taken for this visit.   EXAM: General: Pt appears well and is in NAD  Neck: General: Supple without adenopathy. Thyroid: No goiter or nodules appreciated.   Lungs: Clear with good BS bilat with no rales, rhonchi, or wheezes  Heart: Auscultation: RRR.  Abdomen: Normoactive bowel sounds, soft, nontender, without masses or organomegaly palpable  Extremities:  BL LE: No pretibial edema normal ROM and strength.  Mental Status: Judgment, insight: Intact Orientation: Oriented to time, place, and person Mood and affect: No depression, anxiety, or agitation     DATA REVIEWED:  Results for KEREN, ALVERIO (MRN [id number]) as of 09/20/2020 12:10  Ref. Range  07/20/2020 10:15 09/17/2020 11:35  TSH Latest Ref Range: 0.35 - 4.50 uIU/mL 9.74 (H) 5.79 (H)     ASSESSMENT / PLAN / RECOMMENDATIONS:   1. Postablative Hypothyroidism:  - Pt is clinically euthyroid  - No local neck symptoms  - She takes levothyroxine appropriately  - TSh still high, will adjust the dose as below    Medications  STOP Levothyroxine 112 mcg  Start Levothyroxine 125 mcg daily   F/U in 6 months  Labs in 8 weeks      Signed electronically by: Mack Guise, MD  [facility]  [facility] [address] [place], [place] [address] Phone: [phone] FAX: [phone]      CC: Leeroy Cha, MD [address] [place] [address] Phone: [phone]  Fax: [phone]   Return to Endocrinology clinic as below: Future Appointments  Date Time Provider [place]  09/17/2020 11:10 AM Shamleffer, Melanie Crazier, MD [facility] None  09/20/2020  9:30 AM [facility] None  09/20/2020 10:00 AM [facility] 2 [facility] [place]  09/22/2020  9:15 AM Curt Bears, MD [facility] None

## 2020-09-17 NOTE — Patient Instructions (Signed)
-   Continue Levothyroxine 112 mcg daily for now

## 2020-09-20 ENCOUNTER — Encounter: Payer: Self-pay | Admitting: Internal Medicine

## 2020-09-20 ENCOUNTER — Ambulatory Visit (HOSPITAL_COMMUNITY)
Admission: RE | Admit: 2020-09-20 | Discharge: 2020-09-20 | Disposition: A | Discharge status: Home / Self Care | Payer: Medicare HMO | Source: Ambulatory Visit | Attending: Internal Medicine | Admitting: Internal Medicine

## 2020-09-20 ENCOUNTER — Inpatient Hospital Stay: Payer: Medicare HMO | Attending: Internal Medicine

## 2020-09-20 ENCOUNTER — Encounter (HOSPITAL_COMMUNITY): Payer: Self-pay

## 2020-09-20 ENCOUNTER — Other Ambulatory Visit: Payer: Self-pay

## 2020-09-20 DIAGNOSIS — C7801 Secondary malignant neoplasm of right lung: Secondary | ICD-10-CM | POA: Diagnosis not present

## 2020-09-20 DIAGNOSIS — C3490 Malignant neoplasm of unspecified part of unspecified bronchus or lung: Secondary | ICD-10-CM | POA: Diagnosis not present

## 2020-09-20 DIAGNOSIS — C3432 Malignant neoplasm of lower lobe, left bronchus or lung: Secondary | ICD-10-CM | POA: Insufficient documentation

## 2020-09-20 DIAGNOSIS — C7951 Secondary malignant neoplasm of bone: Secondary | ICD-10-CM | POA: Diagnosis not present

## 2020-09-20 DIAGNOSIS — J432 Centrilobular emphysema: Secondary | ICD-10-CM | POA: Diagnosis not present

## 2020-09-20 DIAGNOSIS — C7802 Secondary malignant neoplasm of left lung: Secondary | ICD-10-CM | POA: Insufficient documentation

## 2020-09-20 DIAGNOSIS — I313 Pericardial effusion (noninflammatory): Secondary | ICD-10-CM | POA: Diagnosis not present

## 2020-09-20 DIAGNOSIS — C799 Secondary malignant neoplasm of unspecified site: Secondary | ICD-10-CM | POA: Diagnosis not present

## 2020-09-20 HISTORY — DX: Malignant (primary) neoplasm, unspecified: C80.1

## 2020-09-20 LAB — CMP (CANCER CENTER ONLY)
ALT: 15 U/L (ref 0–44)
AST: 17 U/L (ref 15–41)
Albumin: 4 g/dL (ref 3.5–5.0)
Alkaline Phosphatase: 117 U/L (ref 38–126)
Anion gap: 8 (ref 5–15)
BUN: 12 mg/dL (ref 8–23)
CO2: 28 mmol/L (ref 22–32)
Calcium: 9.3 mg/dL (ref 8.9–10.3)
Chloride: 104 mmol/L (ref 98–111)
Creatinine: 0.76 mg/dL (ref 0.44–1.00)
GFR, Estimated: >60 mL/min (ref >60)
Glucose, Bld: 99 mg/dL (ref 70–99)
Potassium: 4 mmol/L (ref 3.5–5.1)
Sodium: 140 mmol/L (ref 135–145)
Total Bilirubin: <0.2 mg/dL — ABNORMAL LOW (ref 0.3–1.2)
Total Protein: 7 g/dL (ref 6.5–8.1)

## 2020-09-20 LAB — CBC WITH DIFFERENTIAL (CANCER CENTER ONLY)
Abs Immature Granulocytes: 0.02 10*3/uL (ref 0.00–0.07)
Basophils Absolute: 0 10*3/uL (ref 0.0–0.1)
Basophils Relative: 0 %
Eosinophils Absolute: 0.2 10*3/uL (ref 0.0–0.5)
Eosinophils Relative: 3 %
HCT: 38.5 % (ref 36.0–46.0)
Hemoglobin: 12.5 g/dL (ref 12.0–15.0)
Immature Granulocytes: 0 %
Lymphocytes Relative: 30 %
Lymphs Abs: 2.1 10*3/uL (ref 0.7–4.0)
MCH: 28.4 pg (ref 26.0–34.0)
MCHC: 32.5 g/dL (ref 30.0–36.0)
MCV: 87.5 fL (ref 80.0–100.0)
Monocytes Absolute: 0.4 10*3/uL (ref 0.1–1.0)
Monocytes Relative: 5 %
Neutro Abs: 4.2 10*3/uL (ref 1.7–7.7)
Neutrophils Relative %: 62 %
Platelet Count: 256 10*3/uL (ref 150–400)
RBC: 4.4 MIL/uL (ref 3.87–5.11)
RDW: 14.2 % (ref 11.5–15.5)
WBC Count: 6.8 10*3/uL (ref 4.0–10.5)
nRBC: 0 % (ref 0.0–0.2)

## 2020-09-20 MED ORDER — LEVOTHYROXINE SODIUM 125 MCG PO TABS: 125.0000 ug | ORAL_TABLET | Freq: Every day | ORAL | 3 refills | Status: AC

## 2020-09-20 MED ORDER — IOHEXOL 300 MG/ML  SOLN
75.0000 mL | Freq: Once | INTRAMUSCULAR | Status: AC | PRN
  Administered 2020-09-20: 75 mL via INTRAVENOUS

## 2020-09-22 ENCOUNTER — Inpatient Hospital Stay (HOSPITAL_BASED_OUTPATIENT_CLINIC_OR_DEPARTMENT_OTHER): Payer: Medicare HMO | Admitting: Internal Medicine

## 2020-09-22 ENCOUNTER — Encounter: Payer: Self-pay | Admitting: Internal Medicine

## 2020-09-22 ENCOUNTER — Other Ambulatory Visit: Payer: Self-pay

## 2020-09-22 VITALS — BP 136/98 | HR 84 | Temp 98.0°F | Resp 18 | Ht 60.0 in | Wt 122.0 lb

## 2020-09-22 DIAGNOSIS — C3492 Malignant neoplasm of unspecified part of left bronchus or lung: Secondary | ICD-10-CM | POA: Diagnosis not present

## 2020-09-22 DIAGNOSIS — Z5112 Encounter for antineoplastic immunotherapy: Secondary | ICD-10-CM

## 2020-09-22 DIAGNOSIS — C7802 Secondary malignant neoplasm of left lung: Secondary | ICD-10-CM | POA: Diagnosis not present

## 2020-09-22 DIAGNOSIS — C7801 Secondary malignant neoplasm of right lung: Secondary | ICD-10-CM | POA: Diagnosis not present

## 2020-09-22 DIAGNOSIS — C7951 Secondary malignant neoplasm of bone: Secondary | ICD-10-CM | POA: Diagnosis not present

## 2020-09-22 DIAGNOSIS — C3432 Malignant neoplasm of lower lobe, left bronchus or lung: Secondary | ICD-10-CM | POA: Diagnosis not present

## 2020-09-22 MED ORDER — OXYCODONE-ACETAMINOPHEN 5-325 MG PO TABS: 1.0000 | ORAL_TABLET | Freq: Three times a day (TID) | ORAL | 0 refills | Status: DC | PRN

## 2020-09-22 MED ORDER — PROCHLORPERAZINE MALEATE 10 MG PO TABS: 10.0000 mg | ORAL_TABLET | Freq: Four times a day (QID) | ORAL | 0 refills | Status: AC | PRN

## 2020-09-22 MED ORDER — HYDROCODONE-HOMATROPINE 5-1.5 MG/5ML PO SYRP
5.0000 mL | ORAL_SOLUTION | Freq: Four times a day (QID) | ORAL | 0 refills | Status: DC | PRN
Start: 2020-09-22 — End: 2020-10-07

## 2020-09-22 NOTE — Progress Notes (Signed)
[place] [phone]   [phone]  OFFICE PROGRESS NOTE  Leeroy Cha, MD [address] [place] [address]  DIAGNOSIS:  1) recurrent and metastatic non-small cell lung cancer, adenocarcinoma initially diagnosed as stage IIA (T1a, N1, M0) non-small cell lung cancer, adenocarcinoma presented with right upper lobe small pulmonary nodules as well as metastatic adenocarcinoma to level 12L, diagnosed in February 2019.  The patient had disease recurrence with bilateral pulmonary nodules and biopsy-proven in September 2021. 2) acute pulmonary embolism incidentally diagnosed on CT scan of the chest on 12/24/2018 involving right lower lobe pulmonary artery segmental branches.  Biomarker Findings Microsatellite status - Cannot Be Determined Tumor Mutational Burden - Cannot Be Determined Genomic Findings For a complete list of the genes assayed, please refer to the Appendix. KRAS G12V 7 Disease relevant genes with no reportable alterations: EGFR, ALK, BRAF, MET, RET, ERBB2, ROS1  PDL1 expression 5%.  PRIOR THERAPY: Status post wedge resection of the left upper lobe nodule with thoracic lingular segmentectomy and mediastinal lymph node sampling under the care of Dr. Roxan Hockey on August 13, 2017.  CURRENT THERAPY:  1) treatment with immunotherapy with ipilimumab 1 mg/KG every 6 weeks and nivolumab 3 mg/KG every 2 weeks.  First dose September 29, 2020. 2) Xarelto 15 mg p.o. twice daily for 3 weeks followed by 20 mg p.o. daily 20 mg p.o. daily.  She started the first dose on 12/25/2018.  INTERVAL HISTORY: Rachael Villanueva 70 y.o. female returns to the clinic today for follow-up visit accompanied by her husband.  The patient is feeling fine today with no concerning complaints except for mild swelling in her submandibular area noticed yesterday.  The patient denied having any current chest pain but has shortness of breath with exertion  with mild cough and no hemoptysis.  She has no fever or chills.  She has no nausea, vomiting, diarrhea or constipation.  She has no current headache or visual changes.  She had repeat CT scan of the chest performed recently and she is here for evaluation and discussion of her scan results and treatment options.  She has been very reluctant to consider any treatment in the past.  MEDICAL HISTORY: Past Medical History:  Diagnosis Date  . Anxiety   . Depression   . Graves' disease   . Hypercalcemia   . Hypothyroidism    s/p post ablation for Graves Disease  . Lung nodule   . NSCL ca dx'd 08/11/17  . Sleep disorder   . Thyroid disease   . Vitamin D deficiency     ALLERGIES:  is allergic to grass pollen([id number]-swt vern), molds & smuts, and other.  MEDICATIONS:  Current Outpatient Medications  Medication Sig Dispense Refill  . ALPRAZolam (XANAX) 0.5 MG tablet Take 0.25-0.5 mg by mouth at bedtime as needed for anxiety or sleep.     . Armodafinil 250 MG tablet Take 250 mg by mouth daily.    Marland Kitchen escitalopram (LEXAPRO) 10 MG tablet Take 20 mg by mouth daily.     Marland Kitchen levothyroxine (SYNTHROID) 125 MCG tablet Take 1 tablet (125 mcg total) by mouth daily. 90 tablet 3  . traZODone (DESYREL) 50 MG tablet Take 50-100 mg by mouth at bedtime.  0   No current facility-administered medications for this visit.    SURGICAL HISTORY:  Past Surgical History:  Procedure Laterality Date  . ABLATION     for graves dX  . COLONOSCOPY    .  dental implant    . eye compression Bilateral    due to graves disease done at [facility]   . EYE SURGERY    . PARATHYROIDECTOMY    . VIDEO ASSISTED THORACOSCOPY (VATS)/ LOBECTOMY Left 08/13/2017   Procedure: VIDEO ASSISTED THORACOSCOPY (VATS)/ LOBECTOMY;  Surgeon: Melrose Nakayama, MD;  Location: [facility];  Service: Thoracic;  Laterality: Left;    REVIEW OF SYSTEMS:  Constitutional: positive for fatigue and weight loss Eyes: negative Ears, nose, mouth, throat, and face:  negative Respiratory: positive for cough and dyspnea on exertion Cardiovascular: negative Gastrointestinal: negative Genitourinary:negative Integument/breast: negative Hematologic/lymphatic: negative Musculoskeletal:negative Neurological: negative Behavioral/Psych: positive for anxiety Endocrine: negative Allergic/Immunologic: negative   PHYSICAL EXAMINATION: General appearance: alert, cooperative, fatigued and no distress Head: Normocephalic, without obvious abnormality, atraumatic Neck: no adenopathy, no JVD, supple, symmetrical, trachea midline and thyroid not enlarged, symmetric, no tenderness/mass/nodules Lymph nodes: Cervical, supraclavicular, and axillary nodes normal. Resp: clear to auscultation bilaterally Back: symmetric, no curvature. ROM normal. No CVA tenderness. Cardio: regular rate and rhythm, S1, S2 normal, no murmur, click, rub or gallop GI: soft, non-tender; bowel sounds normal; no masses,  no organomegaly Extremities: extremities normal, atraumatic, no cyanosis or edema Neurologic: Alert and oriented X 3, normal strength and tone. Normal symmetric reflexes. Normal coordination and gait  ECOG PERFORMANCE STATUS: 1 - Symptomatic but completely ambulatory  Blood pressure (!) 136/98, pulse 84, temperature 98 F (36.7 C), temperature source Tympanic, resp. rate 18, height 5' (1.524 m), weight 122 lb (55.3 kg), SpO2 99 %.  LABORATORY DATA: Lab Results  Component Value Date   WBC 6.8 09/20/2020   HGB 12.5 09/20/2020   HCT 38.5 09/20/2020   MCV 87.5 09/20/2020   PLT 256 09/20/2020      Chemistry      Component Value Date/Time   NA 140 09/20/2020 0941   K 4.0 09/20/2020 0941   CL 104 09/20/2020 0941   CO2 28 09/20/2020 0941   BUN 12 09/20/2020 0941   CREATININE 0.76 09/20/2020 0941      Component Value Date/Time   CALCIUM 9.3 09/20/2020 0941   ALKPHOS 117 09/20/2020 0941   AST 17 09/20/2020 0941   ALT 15 09/20/2020 0941   BILITOT <0.2 (L) 09/20/2020  0941       RADIOGRAPHIC STUDIES: CT Chest W Contrast  Result Date: 09/20/2020 CLINICAL DATA:  Metastatic non-small cell lung cancer.  Restaging. EXAM: CT CHEST WITH CONTRAST TECHNIQUE: Multidetector CT imaging of the chest was performed during intravenous contrast administration. CONTRAST:  16m OMNIPAQUE IOHEXOL 300 MG/ML  SOLN COMPARISON:  03/25/2020 PET-CT.  03/16/2020 chest CT. FINDINGS: Cardiovascular: Normal heart size. Trace pericardial effusion/thickening, stable. Atherosclerotic nonaneurysmal thoracic aorta. Normal caliber pulmonary arteries. No central pulmonary emboli. Mediastinum/Nodes: No discrete thyroid nodules. Unremarkable esophagus. No axillary adenopathy. Newly enlarged heterogeneous 1.4 cm subcarinal node (series 2/image 66). Newly mildly enlarged 1.0 cm right hilar node (series 2/image 64). No left hilar adenopathy. Lungs/Pleura: No pneumothorax. No pleural effusion. Moderate centrilobular emphysema with diffuse bronchial wall thickening. Postsurgical changes from posterior left upper lobe wedge resection. Innumerable (> 30) solid pulmonary nodules scattered throughout both lungs, increased since 03/16/2020 CT. Representative 1.1 cm medial basilar right lower lobe nodule (series 7/image 116), increased from 0.7 cm. Representative 1.1 cm basilar left lower lobe nodule (series 7/image 115), increased from 0.6 cm. Representative 0.6 cm right middle lobe nodule (series 7/image 86), increased from 0.4 cm. Upper abdomen: Small hiatal hernia. Musculoskeletal: New lytic destructive thoracic spine metastasis involving the T7  and T8 vertebral bodies (series 6/image 93). Moderate thoracic spondylosis. IMPRESSION: 1. Interval progression of metastatic disease. 2. New lytic destructive thoracic spine metastasis involving the T7 and T8 vertebral bodies. 3. Interval growth of innumerable solid bilateral pulmonary metastases. 4. New mild right hilar and subcarinal metastatic adenopathy. 5. Aortic  Atherosclerosis (ICD10-I70.0) and Emphysema (ICD10-J43.9). Electronically Signed   By: Ilona Sorrel M.D.   On: 09/20/2020 13:08    ASSESSMENT AND PLAN: This is a 70 years old white female recently diagnosed with metastatic non-small cell lung cancer, initially diagnosed as stage IIA (T1a, N1, M0) non-small cell lung cancer, adenocarcinoma with no actionable mutations.  She is status post wedge resection of the left upper lobe nodule with lingular segmentectomy and mediastinal lymph node sampling under the care of Dr. Roxan Hockey on August 13, 2017. The patient declined adjuvant systemic chemotherapy.  The patient had evidence for disease recurrence and metastatic bilateral pulmonary nodule.  This was biopsy-proven from biopsy of her right lower lobe pulmonary nodule and it was consistent with adenocarcinoma.  The tissue block was sent to [facility] for molecular studies and PD-L1 expression but again it showed no actionable mutation and PD-L1 expression was 5%. She declined treatment in the past.  She continued on observation. She had repeat CT scan of the chest performed recently.  I personally and independently reviewed the scan images and discussed the result and showed the images to the patient and her husband. Her scan showed significant disease progression with increasing number and size of innumerable bilateral pulmonary nodules in addition to mediastinal lymphadenopathy and now metastatic disease to the thoracic spine at T7 and T8 vertebral bodies. I had a lengthy discussion again with the patient and her husband about her current condition and treatment options. I gave her the option of palliative care and hospice referral versus consideration of a palliative combination of systemic chemotherapy with carboplatin, Alimta and Keytruda versus treatment with immunotherapy either as a single agent in the form of Keytruda or a combination of Opdivo and Yervoy.  After discussion of the 3 options and  the adverse effects the patient would like to proceed with immunotherapy with Opdivo and Yervoy.  She is expected to start the first cycle of this treatment next week. She will have a chemotherapy education class before the first dose of her treatment. For the metastatic disease to the thoracic spine, I will refer the patient to see Dr. Lisbeth Renshaw for evaluation and palliative radiotherapy to this area. For the cough, I will give her prescription for Hycodan as needed. For the back pain I will give her prescription for Percocet on as-needed basis. The patient will come back for follow-up visit in 3 weeks with day 15 of the immunotherapy. The patient was advised to call immediately if she has any other concerning symptoms in the interval. For the hypothyroidism, she will continue with her endocrinologist for adjustment of her medication. The patient was advised to call immediately if she has any other concerning symptoms in the interval. The patient voices understanding of current disease status and treatment options and is in agreement with the current care plan.  All questions were answered. The patient knows to call the clinic with any problems, questions or concerns. We can certainly see the patient much sooner if necessary.  Disclaimer: This note was dictated with voice recognition software. Similar sounding words can inadvertently be transcribed and may not be corrected upon review.

## 2020-09-22 NOTE — Progress Notes (Signed)
START OFF PATHWAY REGIMEN - Non-Small Cell Lung   [id number]:Ipilimumab 1 mg/kg IV D1 + Nivolumab 3 mg/kg IV D1,15,29 q42 Days:   A cycle is every 42 days:     Ipilimumab      Nivolumab   **Always confirm dose/schedule in your pharmacy ordering system**  Patient Characteristics: Stage IV Metastatic, Nonsquamous, Molecular Analysis Completed, Molecular Alteration Present and Targeted Therapy Exhausted OR EGFR Exon 20+ or KRAS G12C+ Present and No Prior Chemo/Immunotherapy OR No Alteration Present, Initial  Chemotherapy/Immunotherapy, PS = 0, 1, No Alteration Present, No Alteration Present, Candidate for Immunotherapy, PD-L1 Expression Positive 1-49% (TPS) / Negative / Not Tested / Awaiting Test Results and Immunotherapy Candidate Therapeutic Status: Stage IV Metastatic Histology: Nonsquamous Cell Broad Molecular Profiling Status: Molecular Analysis Completed Molecular Analysis Results: No Alteration Present ECOG Performance Status: 1 Chemotherapy/Immunotherapy Line of Therapy: Initial Chemotherapy/Immunotherapy EGFR Exons 18-21 Mutation Testing Status: Completed and Negative ALK Fusion/Rearrangement Testing Status: Completed and Negative BRAF V600 Mutation Testing Status: Completed and Negative KRAS G12C Mutation Testing Status: Completed and Negative MET Exon 14 Mutation Testing Status: Completed and Negative RET Fusion/Rearrangement Testing Status: Completed and Negative NTRK Fusion/Rearrangement Testing Status: Completed and Negative ROS1 Fusion/Rearrangement Testing Status: Completed and Negative Immunotherapy Candidate Status: Candidate for Immunotherapy PD-L1 Expression Status: PD-L1 Positive 1-49% (TPS) Intent of Therapy: Non-Curative / Palliative Intent, Discussed with Patient

## 2020-09-22 NOTE — Patient Instructions (Signed)
Nivolumab injection What is this medicine? NIVOLUMAB (nye VOL ue mab) is a monoclonal antibody. It treats certain types of cancer. Some of the cancers treated are colon cancer, head and neck cancer, Hodgkin lymphoma, lung cancer, and melanoma. This medicine may be used for other purposes; ask your health care provider or pharmacist if you have questions. COMMON BRAND NAME(S): Opdivo What should I tell my health care provider before I take this medicine? They need to know if you have any of these conditions:  autoimmune diseases like Crohn's disease, ulcerative colitis, or lupus  have had or planning to have an allogeneic stem cell transplant (uses someone else's stem cells)  history of chest radiation  history of organ transplant  nervous system problems like myasthenia gravis or Guillain-Barre syndrome  an unusual or allergic reaction to nivolumab, other medicines, foods, dyes, or preservatives  pregnant or trying to get pregnant  breast-feeding How should I use this medicine? This medicine is for infusion into a vein. It is given by a health care professional in a hospital or clinic setting. A special MedGuide will be given to you before each treatment. Be sure to read this information carefully each time. Talk to your pediatrician regarding the use of this medicine in children. While this drug may be prescribed for children as young as 12 years for selected conditions, precautions do apply. Overdosage: If you think you have taken too much of this medicine contact a poison control center or emergency room at once. NOTE: This medicine is only for you. Do not share this medicine with others. What if I miss a dose? It is important not to miss your dose. Call your doctor or health care professional if you are unable to keep an appointment. What may interact with this medicine? Interactions have not been studied. This list may not describe all possible interactions. Give your health  care provider a list of all the medicines, herbs, non-prescription drugs, or dietary supplements you use. Also tell them if you smoke, drink alcohol, or use illegal drugs. Some items may interact with your medicine. What should I watch for while using this medicine? This drug may make you feel generally unwell. Continue your course of treatment even though you feel ill unless your doctor tells you to stop. You may need blood work done while you are taking this medicine. Do not become pregnant while taking this medicine or for 5 months after stopping it. Women should inform their doctor if they wish to become pregnant or think they might be pregnant. There is a potential for serious side effects to an unborn child. Talk to your health care professional or pharmacist for more information. Do not breast-feed an infant while taking this medicine or for 5 months after stopping it. What side effects may I notice from receiving this medicine? Side effects that you should report to your doctor or health care professional as soon as possible:  allergic reactions like skin rash, itching or hives, swelling of the face, lips, or tongue  breathing problems  blood in the urine  bloody or watery diarrhea or black, tarry stools  changes in emotions or moods  changes in vision  chest pain  cough  dizziness  feeling faint or lightheaded, falls  fever, chills  headache with fever, neck stiffness, confusion, loss of memory, sensitivity to light, hallucination, loss of contact with reality, or seizures  joint pain  mouth sores  redness, blistering, peeling or loosening of the skin, including inside the  mouth  severe muscle pain or weakness  signs and symptoms of high blood sugar such as dizziness; dry mouth; dry skin; fruity breath; nausea; stomach pain; increased hunger or thirst; increased urination  signs and symptoms of kidney injury like trouble passing urine or change in the amount of  urine  signs and symptoms of liver injury like dark yellow or brown urine; general ill feeling or flu-like symptoms; light-colored stools; loss of appetite; nausea; right upper belly pain; unusually weak or tired; yellowing of the eyes or skin  swelling of the ankles, feet, hands  trouble passing urine or change in the amount of urine  unusually weak or tired  weight gain or loss Side effects that usually do not require medical attention (report to your doctor or health care professional if they continue or are bothersome):  bone pain  constipation  decreased appetite  diarrhea  muscle pain  nausea, vomiting  tiredness This list may not describe all possible side effects. Call your doctor for medical advice about side effects. You may report side effects to FDA at 1-800-FDA-1088. Where should I keep my medicine? This drug is given in a hospital or clinic and will not be stored at home. NOTE: This sheet is a summary. It may not cover all possible information. If you have questions about this medicine, talk to your doctor, pharmacist, or health care provider.  2021 Elsevier/Gold Standard (2019-10-29 10:08:25) Ipilimumab injection What is this medicine? IPILIMUMAB (IP i LIM ue mab) is a monoclonal antibody. It is used to treat colorectal cancer, kidney cancer, liver cancer, lung cancer, melanoma, and mesothelioma. This medicine may be used for other purposes; ask your health care provider or pharmacist if you have questions. COMMON BRAND NAME(S): YERVOY What should I tell my health care provider before I take this medicine? They need to know if you have any of these conditions:  autoimmune diseases like Crohn's disease, ulcerative colitis, or lupus  have had or planning to have an allogeneic stem cell transplant (uses someone else's stem cells)  history of organ transplant  nervous system problems like myasthenia gravis or Guillain-Barre syndrome  an unusual or allergic  reaction to ipilimumab, other medicines, foods, dyes, or preservatives  pregnant or trying to get pregnant  breast-feeding How should I use this medicine? This medicine is for infusion into a vein. It is given by a health care professional in a hospital or clinic setting. A special MedGuide will be given to you before each treatment. Be sure to read this information carefully each time. Talk to your pediatrician regarding the use of this medicine in children. While this drug may be prescribed for children as young as 12 years for selected conditions, precautions do apply. Overdosage: If you think you have taken too much of this medicine contact a poison control center or emergency room at once. NOTE: This medicine is only for you. Do not share this medicine with others. What if I miss a dose? It is important not to miss your dose. Call your doctor or health care professional if you are unable to keep an appointment. What may interact with this medicine? Interactions are not expected. This list may not describe all possible interactions. Give your health care provider a list of all the medicines, herbs, non-prescription drugs, or dietary supplements you use. Also tell them if you smoke, drink alcohol, or use illegal drugs. Some items may interact with your medicine. What should I watch for while using this medicine? Tell your  doctor or healthcare professional if your symptoms do not start to get better or if they get worse. Do not become pregnant while taking this medicine or for 3 months after stopping it. Women should inform their doctor if they wish to become pregnant or think they might be pregnant. There is a potential for serious side effects to an unborn child. Talk to your health care professional or pharmacist for more information. Do not breast-feed an infant while taking this medicine or for 3 months after the last dose. Your condition will be monitored carefully while you are receiving  this medicine. You may need blood work done while you are taking this medicine. What side effects may I notice from receiving this medicine? Side effects that you should report to your doctor or health care professional as soon as possible:  allergic reactions like skin rash, itching or hives, swelling of the face, lips, or tongue  black, tarry stools  bloody or watery diarrhea  changes in vision  dizziness  eye pain  fast, irregular heartbeat  feeling anxious  feeling faint or lightheaded, falls  nausea, vomiting  pain, tingling, numbness in the hands or feet  redness, blistering, peeling or loosening of the skin, including inside the mouth  signs and symptoms of liver injury like dark yellow or brown urine; general ill feeling or flu-like symptoms; light-colored stools; loss of appetite; nausea; right upper belly pain; unusually weak or tired; yellowing of the eyes or skin  unusual bleeding or bruising Side effects that usually do not require medical attention (report to your doctor or health care professional if they continue or are bothersome):  headache  loss of appetite  trouble sleeping This list may not describe all possible side effects. Call your doctor for medical advice about side effects. You may report side effects to FDA at 1-800-FDA-1088. Where should I keep my medicine? This drug is given in a hospital or clinic and will not be stored at home. NOTE: This sheet is a summary. It may not cover all possible information. If you have questions about this medicine, talk to your doctor, pharmacist, or health care provider.  2021 Elsevier/Gold Standard (2019-05-28 18:53:00)

## 2020-09-24 ENCOUNTER — Telehealth: Payer: Self-pay | Admitting: Internal Medicine

## 2020-09-24 NOTE — Telephone Encounter (Signed)
Scheduled per los. Called and left msg of all scheduled appts. Advised will call back once appt next week is approved by infusion

## 2020-09-25 ENCOUNTER — Encounter: Payer: Self-pay | Admitting: Internal Medicine

## 2020-09-27 NOTE — Progress Notes (Incomplete)
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  New lytic destructive thoracic spine metastasis involving the T7 and T8 vertebral bodies.  Interval growth of innumerable solid bilateral pulmonary metastases.  New mild right hilar and subcarinal metastatic adenopathy  Past/Anticipated chemotherapy by medical oncology, if any:  Dr. Julien Nordmann 09/22/2020 -Her scan showed significant disease progression with increasing number and size of innumerable bilateral pulmonary nodules in addition to mediastinal lymphadenopathy and now metastatic disease to the thoracic spine at T7 and T8 vertebral bodies. -I gave her the option of palliative care and hospice referral versus consideration of a palliative combination of systemic chemotherapy with carboplatin, Alimta and Keytruda versus treatment with immunotherapy either as a single agent in the form of Keytruda or a combination of Opdivo and Yervoy.  -After discussion of the 3 options and the adverse effects the patient would like to proceed with immunotherapy with Opdivo and Yervoy.  She is expected to start the first cycle of this treatment next week. -For the metastatic disease to the thoracic spine, I will refer the patient to see Dr. Lisbeth Renshaw for evaluation and palliative radiotherapy to this area.   Pain on a scale of 0-10 is:    If Spine Met(s), symptoms, if any, include:  Bowel/Bladder retention or incontinence (please describe):   Numbness or weakness in extremities (please describe):   Current Decadron regimen, if applicable:   Ambulatory status? Walker? Wheelchair?:   SAFETY ISSUES:  Prior radiation?   Pacemaker/ICD?   Possible current pregnancy? Postmenopausal  Is the patient on methotrexate?   Current Complaints / other details:   - Status post wedge resection of the left  upper lobe nodule with lingular segmentectomy and mediastinal lymph node sampling- Dr. Roxan Hockey 08/13/2017. 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

## 2020-09-28 ENCOUNTER — Ambulatory Visit: Admission: RE | Admit: 2020-09-28 | Payer: Medicare HMO | Source: Ambulatory Visit

## 2020-09-28 ENCOUNTER — Ambulatory Visit: Admission: RE | Admit: 2020-09-28 | Payer: Medicare HMO | Source: Ambulatory Visit | Admitting: Radiation Oncology

## 2020-09-28 ENCOUNTER — Inpatient Hospital Stay: Payer: Medicare HMO

## 2020-09-28 ENCOUNTER — Telehealth: Payer: Self-pay

## 2020-09-28 DIAGNOSIS — G893 Neoplasm related pain (acute) (chronic): Secondary | ICD-10-CM | POA: Insufficient documentation

## 2020-09-28 DIAGNOSIS — C7951 Secondary malignant neoplasm of bone: Secondary | ICD-10-CM | POA: Insufficient documentation

## 2020-09-28 NOTE — Telephone Encounter (Signed)
Pt called wanting all her her appts cancelled and states she only wants to hear from Hospice. Pt wants to also make sure her appt with Dr. Lisbeth Renshaw today 09/28/20 at 1pm is also cancelled.

## 2020-09-29 ENCOUNTER — Ambulatory Visit: Payer: Medicare HMO

## 2020-09-29 ENCOUNTER — Inpatient Hospital Stay: Payer: Medicare HMO

## 2020-10-01 NOTE — Progress Notes (Signed)
Pharmacist Chemotherapy Monitoring - Initial Assessment    Anticipated start date: 10/13/20   Regimen:  . Are orders appropriate based on the patient's diagnosis, regimen, and cycle? Yes . Does the plan date match the patient's scheduled date? Yes . Is the sequencing of drugs appropriate? Yes . Are the premedications appropriate for the patient's regimen? Yes . Prior Authorization for treatment is: Not Started o If applicable, is the correct biosimilar selected based on the patient's insurance? yes  Organ Function and Labs: Marland Kitchen Are dose adjustments needed based on the patient's renal function, hepatic function, or hematologic function? Yes . Are appropriate labs ordered prior to the start of patient's treatment? Yes . Other organ system assessment, if indicated: N/A . The following baseline labs, if indicated, have been ordered: ipilimumab: baseline TSH +/- T4 and nivolumab: baseline TSH +/- T4  Dose Assessment: . Are the drug doses appropriate? Yes . Are the following correct: o Drug concentrations Yes o IV fluid compatible with drug Yes o Administration routes Yes o Timing of therapy Yes . If applicable, does the patient have documented access for treatment and/or plans for port-a-cath placement? yes . If applicable, have lifetime cumulative doses been properly documented and assessed? yes Lifetime Dose Tracking  No doses have been documented on this patient for the following tracked chemicals: Doxorubicin, Epirubicin, Idarubicin, Daunorubicin, Mitoxantrone, Bleomycin, Oxaliplatin, Carboplatin, Liposomal Doxorubicin  o   Toxicity Monitoring/Prevention: . The patient has the following take home antiemetics prescribed: N/A . The patient has the following take home medications prescribed: N/A . Medication allergies and previous infusion related reactions, if applicable, have been reviewed and addressed. Yes . The patient's current medication list has been assessed for drug-drug  interactions with their chemotherapy regimen. no significant drug-drug interactions were identified on review.  Order Review: . Are the treatment plan orders signed? Yes . Is the patient scheduled to see a provider prior to their treatment? Yes  I verify that I have reviewed each item in the above checklist and answered each question accordingly.  Carwile,Matthew K, RPH, 10/01/2020  8:45 AM

## 2020-10-05 ENCOUNTER — Other Ambulatory Visit: Payer: Self-pay | Admitting: Internal Medicine

## 2020-10-05 ENCOUNTER — Telehealth: Payer: Self-pay | Admitting: Medical Oncology

## 2020-10-05 NOTE — Telephone Encounter (Signed)
Refill-request for hycodan. Referred to Hospice per pt request and all appts cancelled . LVM about this.

## 2020-10-06 ENCOUNTER — Other Ambulatory Visit: Payer: Self-pay | Admitting: Physician Assistant

## 2020-10-06 DIAGNOSIS — R059 Cough, unspecified: Secondary | ICD-10-CM

## 2020-10-06 NOTE — Telephone Encounter (Signed)
Pt LM requesting a return call.  I have called the pt back and she was extremely upset I returned her call. Pt states "my message was for Diane!". I advised the pt Diane was gone for the day which is why I was returning her call. Pt restated her grievance. I advised the pt I will pass her message on to [place].

## 2020-10-07 ENCOUNTER — Telehealth: Payer: Self-pay | Admitting: Medical Oncology

## 2020-10-07 ENCOUNTER — Other Ambulatory Visit: Payer: Self-pay | Admitting: Physician Assistant

## 2020-10-07 DIAGNOSIS — C3492 Malignant neoplasm of unspecified part of left bronchus or lung: Secondary | ICD-10-CM

## 2020-10-07 MED ORDER — HYDROCODONE-HOMATROPINE 5-1.5 MG/5ML PO SYRP: 5.0000 mL | ORAL_SOLUTION | Freq: Four times a day (QID) | ORAL | 0 refills | Status: DC | PRN

## 2020-10-07 NOTE — Telephone Encounter (Signed)
Katini @hospice  has room to take pt , however, she has not been able to get in touch with pt. I confirmed phone numbers.

## 2020-10-13 ENCOUNTER — Other Ambulatory Visit: Payer: Medicare HMO

## 2020-10-13 ENCOUNTER — Ambulatory Visit: Payer: Medicare HMO

## 2020-10-13 ENCOUNTER — Ambulatory Visit: Payer: Medicare HMO | Admitting: Internal Medicine

## 2020-10-14 ENCOUNTER — Other Ambulatory Visit: Payer: Self-pay | Admitting: Internal Medicine

## 2020-10-14 ENCOUNTER — Telehealth: Payer: Self-pay

## 2020-10-14 MED ORDER — OXYCODONE-ACETAMINOPHEN 5-325 MG PO TABS: 1.0000 | ORAL_TABLET | Freq: Three times a day (TID) | ORAL | 0 refills | Status: AC | PRN

## 2020-10-14 NOTE — Telephone Encounter (Signed)
Pt is requesting a refill of Percocet.  Pt was referred to Hospice and they had trouble reaching her and she has advised she scheduled them to come 10/29/20. Pt states she may call them back and have them come sooner.

## 2020-10-17 IMAGING — PT NM PET TUM IMG RESTAG (PS) SKULL BASE T - THIGH
7 series · 25 of 25 positions shown · non-contrast
Comparison: Chest CTs, most recent 03/16/2020. PET of 06/26/2017

CLINICAL DATA: Subsequent treatment strategy for non-small-cell
lung cancer. Restaging. New pulmonary nodules on CT. COVID vaccine
x2 in left arm, unsure of date.

EXAM:
NUCLEAR MEDICINE PET SKULL BASE TO THIGH
TECHNIQUE: 6.5 mCi F-18 FDG was injected intravenously. Full-ring PET imaging
was performed from the skull base to thigh after the radiotracer. CT
data was obtained and used for attenuation correction and anatomic
localization.
Fasting blood glucose: 102 mg/dl

[Series 3: pet sk_thigh ac · axial · 5.0mm · 4.07mm/px · z∈[-800,+12]mm · 5 of 204 slices shown]
[im 1/204]
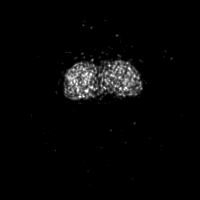
[im 51/204]
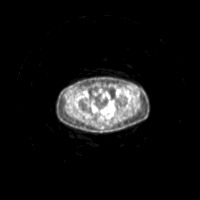
[im 102/204]
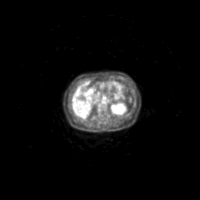
[im 153/204]
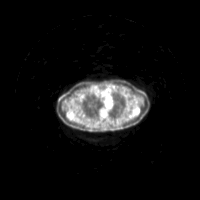
[im 204/204]
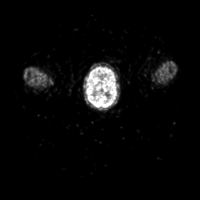

[Series 4: ct sk_thigh 5.0 b31f · axial · 5.0mm · 0.98mm/px · z∈[-800,+12]mm · 5 of 204 slices shown]
[im 1/204]
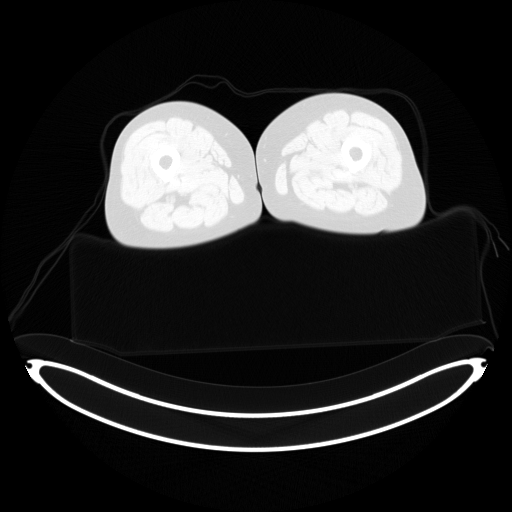
[im 51/204]
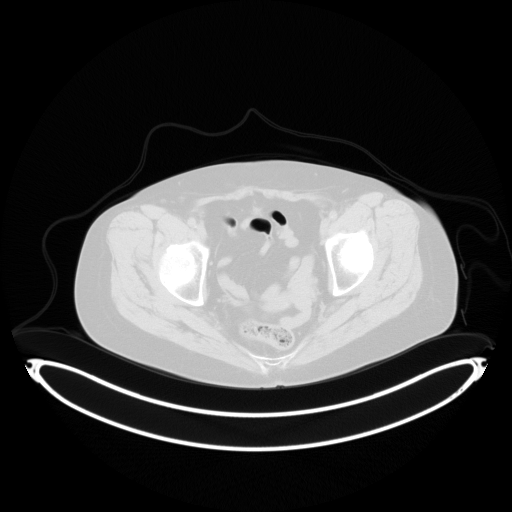
[im 102/204]
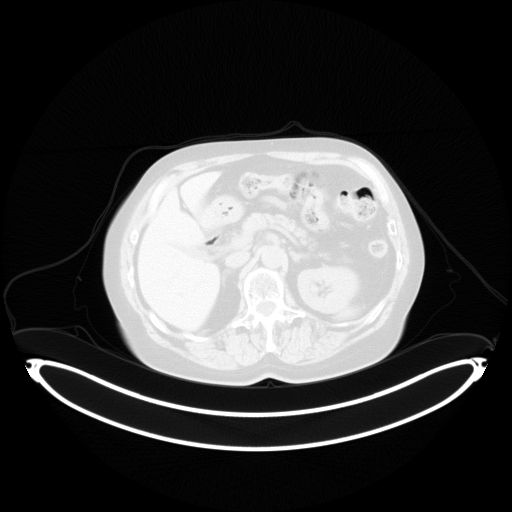
[im 153/204]
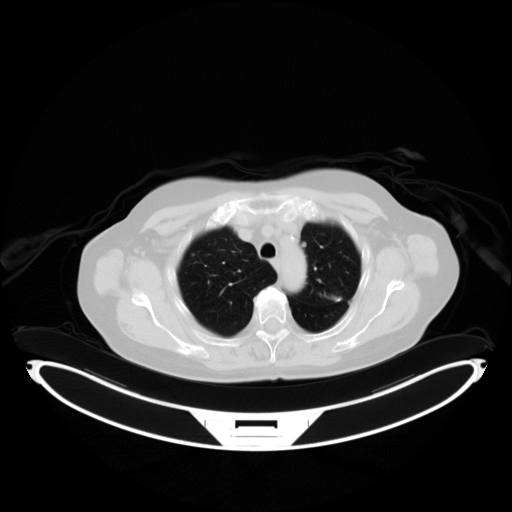
[im 204/204  brain]
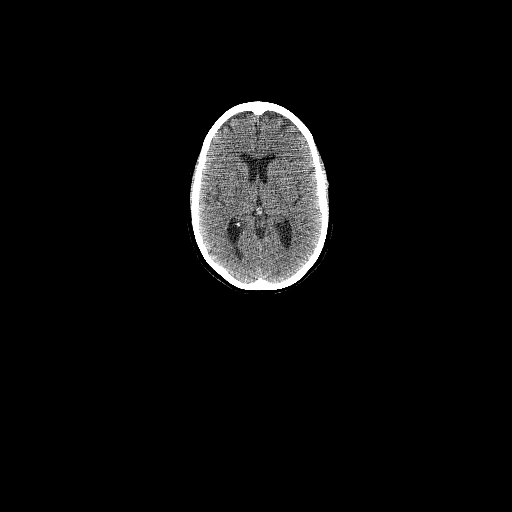

[Series 5: pet sk_thigh nac · axial · 5.0mm · 4.07mm/px · z∈[-800,+12]mm · 5 of 204 slices shown]
[im 1/204]
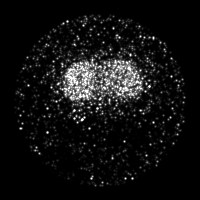
[im 51/204]
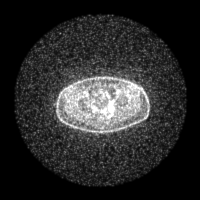
[im 102/204]
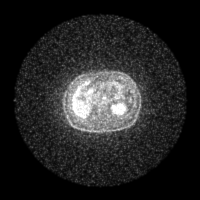
[im 153/204]
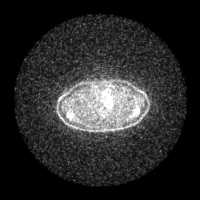
[im 204/204]
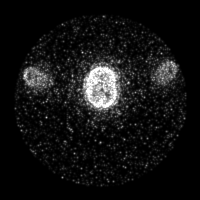

[Series 8: ct sk_thigh 5.0 b70f lung_bone · axial · 5.0mm · 0.68mm/px · z∈[-377,-133]mm · 2 of 62 slices shown]
[im 1/62  bone]
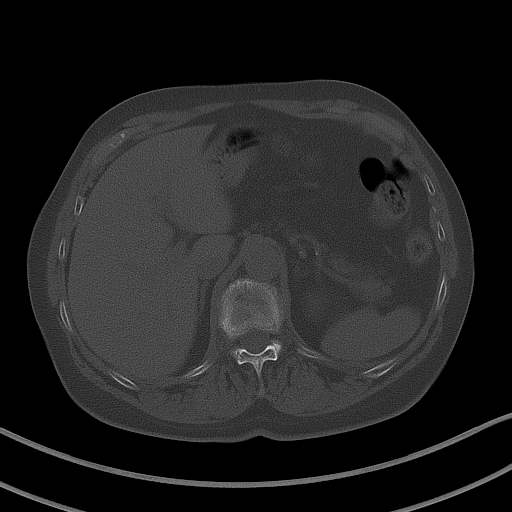
[im 62/62  bone]
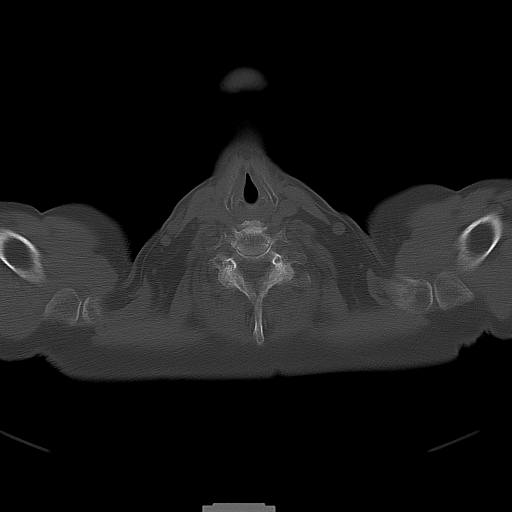

[Series 603: mip range 5 · coronal · 1.69mm/px · 1 of 32 slices shown]
[im 1/32]
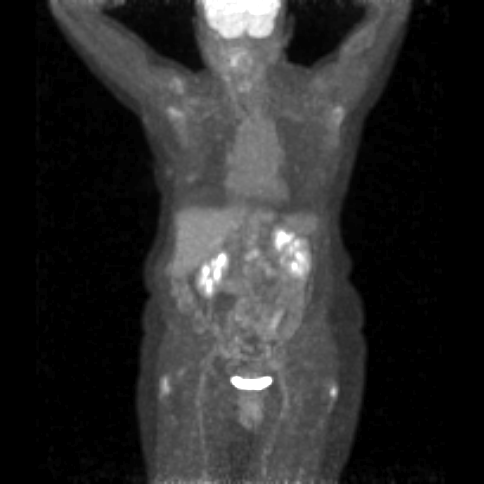

[Series 604: range-ct sk_thigh 5.0 (id)<alpha range> · 2 of 80 slices shown (1 of 2)]
[im 1/80]
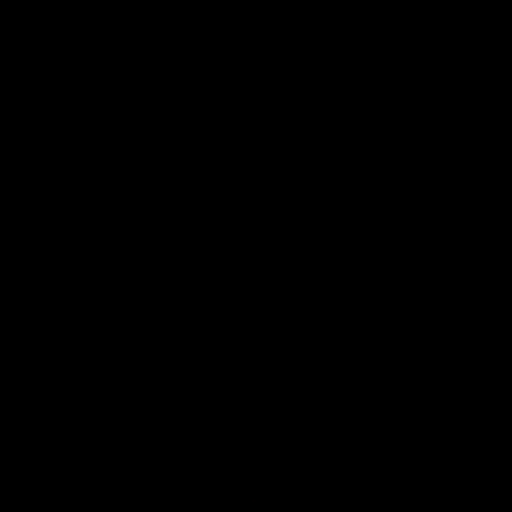
[im 80/80]
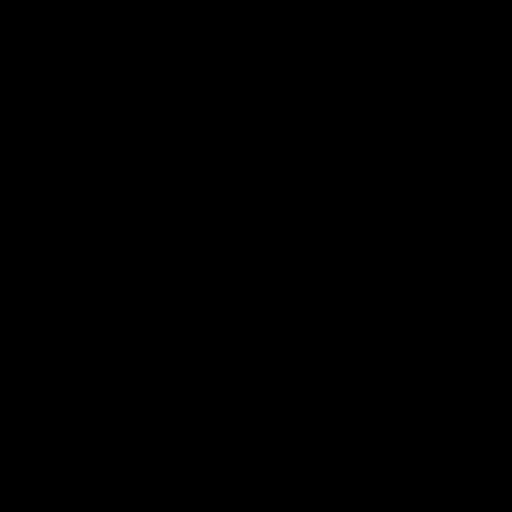

[Series 605: range-ct sk_thigh 5.0 (id)<alpha range> · 5 of 195 slices shown (2 of 2)]
[im 1/195]
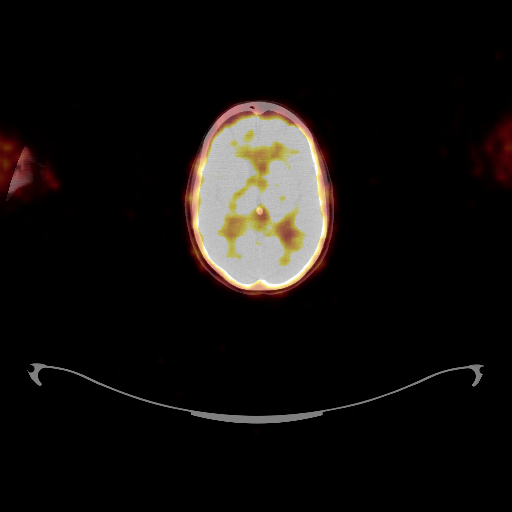
[im 49/195]
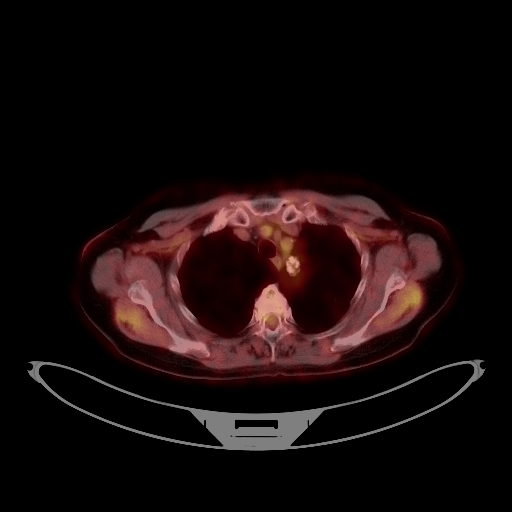
[im 98/195]
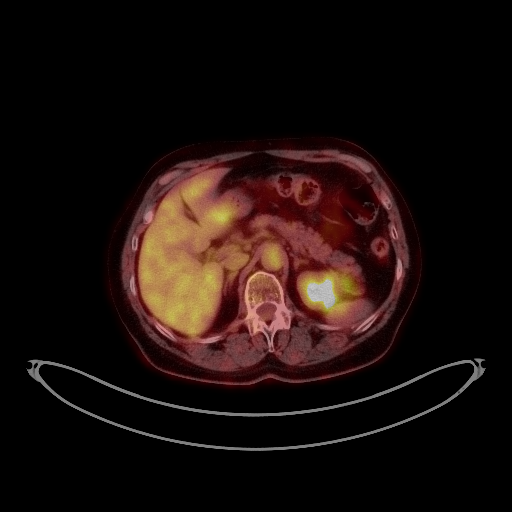
[im 146/195]
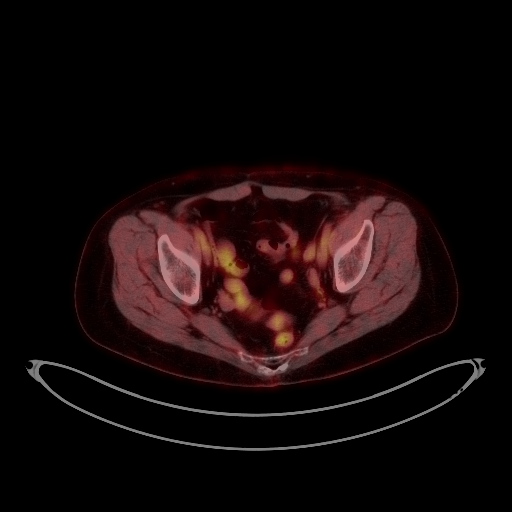
[im 195/195]
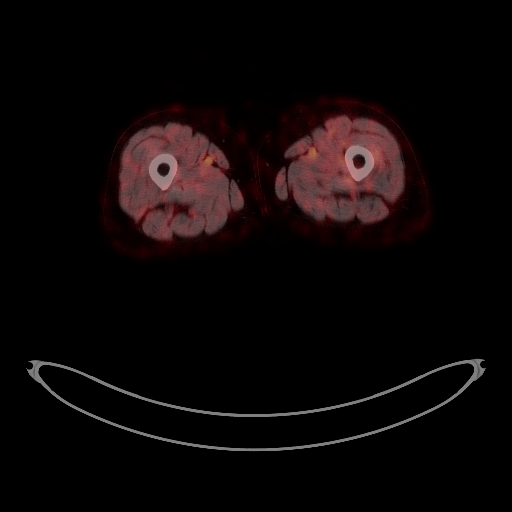

[25 of 25 positions shown; findings below may reference images not displayed]

FINDINGS: Mediastinal blood pool activity: SUV max

Liver activity: SUV max NA

NECK: No areas of abnormal hypermetabolism.

Incidental CT findings: No cervical adenopathy. Mucosal thickening
of the right maxillary sinus. Fluid within the sphenoid sinus.

CHEST: No hypermetabolism to correspond to the bilateral pulmonary
nodules. These are primarily below PET resolution.

Subcarinal hypermetabolism without adenopathy. This measures a
S.U.V. max of 3.0, including on 65/4

Incidental CT findings: Multiple bilateral pulmonary nodules again
identified. Similar in size and distribution to on the prior exam.
An index right lower lobe 6 mm nodule on 39/8 is unchanged.

Aortic and coronary artery atherosclerosis. Tiny hiatal hernia. Mild
centrilobular emphysema.

ABDOMEN/PELVIS: No abdominopelvic parenchymal or nodal
hypermetabolism.

Incidental CT findings: Normal adrenal glands. Abdominal aortic
atherosclerosis. Pelvic floor laxity.

SKELETON: Hypermetabolism superficial to both greater trochanters is
likely indicative of trochanteric bursitis. There is also rotator
cuff hypermetabolism which is likely degenerative.

Incidental CT findings: Osteopenia
IMPRESSION: 1. No hypermetabolism to correspond to the bilateral pulmonary
nodules. These are primarily below PET resolution. There remain
suspicious for pulmonary metastasis.
2. Nonspecific hypermetabolism in the subcarinal station, without
adenopathy. Favored to be physiologic/reactive. Recommend attention
on follow-up.
3. No evidence of hypermetabolic extrathoracic metastasis.
4. Aortic atherosclerosis ([id number]-J9Q.Q), coronary artery
atherosclerosis and emphysema ([id number].E).
5. Sinus disease.

## 2020-10-19 ENCOUNTER — Encounter: Payer: Self-pay | Admitting: Internal Medicine

## 2020-10-21 ENCOUNTER — Other Ambulatory Visit: Payer: Self-pay | Admitting: Physician Assistant

## 2020-10-21 DIAGNOSIS — C3492 Malignant neoplasm of unspecified part of left bronchus or lung: Secondary | ICD-10-CM

## 2020-10-21 MED ORDER — HYDROCODONE-HOMATROPINE 5-1.5 MG/5ML PO SYRP: 5.0000 mL | ORAL_SOLUTION | Freq: Four times a day (QID) | ORAL | 0 refills | Status: AC | PRN

## 2020-10-26 ENCOUNTER — Telehealth: Payer: Self-pay

## 2020-10-26 NOTE — Telephone Encounter (Signed)
Pamala Hurry with [facility] called requesting a refill of Percocet for the pt. Marland Mcalpine has been advised to have the hospice MD provide pts refills. She expressed understanding of this information.

## 2020-10-27 ENCOUNTER — Other Ambulatory Visit: Payer: Medicare HMO

## 2020-10-27 ENCOUNTER — Ambulatory Visit: Payer: Medicare HMO

## 2020-10-27 ENCOUNTER — Ambulatory Visit: Payer: Medicare HMO | Admitting: Physician Assistant

## 2020-11-08 ENCOUNTER — Encounter: Payer: Self-pay | Admitting: Internal Medicine

## 2020-11-08 ENCOUNTER — Telehealth: Payer: Self-pay | Admitting: Internal Medicine

## 2020-11-08 NOTE — Telephone Encounter (Signed)
Pt called back hoping to speak with Vallarie Mare, however I advised her Vallarie Mare is in clinic at the moment and couldn't come to the phone. Pt would like a call back at [phone]

## 2020-11-08 NOTE — Telephone Encounter (Signed)
Pt would like a phone call back regarding her lab on 11/11/2020

## 2020-11-09 ENCOUNTER — Encounter: Payer: Self-pay | Admitting: Internal Medicine

## 2020-11-09 NOTE — Telephone Encounter (Signed)
Noted. Unable to call patient as requested. I did reply to patient through MyChart this morning.

## 2020-11-09 NOTE — Telephone Encounter (Signed)
Message left for patient to return my call.  

## 2020-11-10 ENCOUNTER — Ambulatory Visit: Payer: Medicare HMO | Admitting: Internal Medicine

## 2020-11-10 ENCOUNTER — Other Ambulatory Visit: Payer: Medicare HMO

## 2020-11-10 ENCOUNTER — Ambulatory Visit: Payer: Medicare HMO

## 2020-11-10 NOTE — Telephone Encounter (Signed)
I did try to call patient this morning and she did not answer the phone.   I have called patient and again. This time was able to get a hold patient. Patient stated that she has lung cancer and will not be able to come to the lab appointment on 11/11/2020. She asked for Dr Kelton Pillar to send someone to draw her blood for her thyroid to her home. I inform patient that we do not provide that kind service.  Patient also mention hospice and that hospice nurse would not do it because it is not related to lung cancer.  I have suggested that since her oncologist place the hospice service for patient, she can call Oncology to have hospice if they are willing to add lab order for the thyroid.   Patient stated that she need Dr Kelton Pillar to get someone to get this lab completed at her home.

## 2020-11-10 NOTE — Telephone Encounter (Signed)
This have further addressed through patient's message on MyChart

## 2020-11-10 NOTE — Telephone Encounter (Signed)
Pt called back to request to speak to [facility] or her nurse. I advised her they are in clinic and whatever questions she has I can send to the nurse or she can send through [facility] for her to go ahead with working on getting her answers and pt says "I have already sent a mychart message, she needs to call me."    Pt requests a call at [phone]

## 2020-11-11 ENCOUNTER — Other Ambulatory Visit: Payer: Medicare HMO

## 2020-11-24 ENCOUNTER — Ambulatory Visit: Payer: Medicare HMO

## 2020-11-24 ENCOUNTER — Other Ambulatory Visit: Payer: Medicare HMO

## 2020-11-24 ENCOUNTER — Ambulatory Visit: Payer: Medicare HMO | Admitting: Internal Medicine

## 2021-01-07 DEATH — deceased

## 2021-03-23 ENCOUNTER — Ambulatory Visit: Payer: Medicare HMO | Admitting: Internal Medicine
